# Patient Record
Sex: Male | Born: 1950 | Race: White | Hispanic: No | Marital: Married | Smoking: Never smoker
Health system: Southern US, Community
[De-identification: ages and names within clinical notes are randomized; demographics above are authoritative.]

## PROBLEM LIST (undated history)

## (undated) ENCOUNTER — Telehealth: Attending: Dermatology | Primary: Dermatology

## (undated) ENCOUNTER — Encounter: Attending: Dermatology | Primary: Dermatology

## (undated) ENCOUNTER — Ambulatory Visit

## (undated) ENCOUNTER — Telehealth

## (undated) ENCOUNTER — Ambulatory Visit: Payer: MEDICARE | Attending: Dermatology | Primary: Dermatology

## (undated) ENCOUNTER — Encounter

## (undated) DIAGNOSIS — E039 Hypothyroidism, unspecified: Secondary | ICD-10-CM

## (undated) DIAGNOSIS — G4730 Sleep apnea, unspecified: Secondary | ICD-10-CM

## (undated) DIAGNOSIS — F32A Depression, unspecified: Secondary | ICD-10-CM

## (undated) DIAGNOSIS — K219 Gastro-esophageal reflux disease without esophagitis: Secondary | ICD-10-CM

## (undated) DIAGNOSIS — I10 Essential (primary) hypertension: Secondary | ICD-10-CM

## (undated) DIAGNOSIS — F419 Anxiety disorder, unspecified: Secondary | ICD-10-CM

## (undated) DIAGNOSIS — C73 Malignant neoplasm of thyroid gland: Secondary | ICD-10-CM

## (undated) DIAGNOSIS — M1990 Unspecified osteoarthritis, unspecified site: Secondary | ICD-10-CM

## (undated) DIAGNOSIS — M4800 Spinal stenosis, site unspecified: Secondary | ICD-10-CM

## (undated) DIAGNOSIS — Z85850 Personal history of malignant neoplasm of thyroid: Secondary | ICD-10-CM

## (undated) DIAGNOSIS — N400 Enlarged prostate without lower urinary tract symptoms: Secondary | ICD-10-CM

## (undated) DIAGNOSIS — Z872 Personal history of diseases of the skin and subcutaneous tissue: Secondary | ICD-10-CM

## (undated) HISTORY — DX: Gastro-esophageal reflux disease without esophagitis: K21.9

## (undated) HISTORY — PX: APPENDECTOMY: SHX54

## (undated) HISTORY — DX: Benign prostatic hyperplasia without lower urinary tract symptoms: N40.0

## (undated) HISTORY — DX: Malignant neoplasm of thyroid gland: C73

## (undated) HISTORY — DX: Essential (primary) hypertension: I10

## (undated) HISTORY — PX: LUMBAR LAMINECTOMY: SHX95

## (undated) HISTORY — PX: ROTATOR CUFF REPAIR: SHX139

## (undated) MED ORDER — MUPIROCIN 2 % TOPICAL OINTMENT: 0 days

---

## 2004-09-13 HISTORY — PX: THYROIDECTOMY: SHX17

## 2005-06-16 ENCOUNTER — Ambulatory Visit: Payer: Self-pay

## 2005-07-06 ENCOUNTER — Ambulatory Visit: Payer: Self-pay | Admitting: Internal Medicine

## 2005-07-27 ENCOUNTER — Ambulatory Visit: Payer: Self-pay | Admitting: Internal Medicine

## 2005-08-12 ENCOUNTER — Ambulatory Visit: Payer: Self-pay | Admitting: Unknown Physician Specialty

## 2005-09-10 ENCOUNTER — Ambulatory Visit: Payer: Self-pay | Admitting: Unknown Physician Specialty

## 2005-09-22 ENCOUNTER — Ambulatory Visit: Payer: Self-pay | Admitting: Oncology

## 2005-10-04 ENCOUNTER — Ambulatory Visit: Payer: Self-pay | Admitting: Unknown Physician Specialty

## 2005-10-15 ENCOUNTER — Ambulatory Visit: Payer: Self-pay | Admitting: Oncology

## 2005-11-12 ENCOUNTER — Ambulatory Visit: Payer: Self-pay | Admitting: Oncology

## 2005-11-15 ENCOUNTER — Ambulatory Visit: Payer: Self-pay | Admitting: Oncology

## 2005-12-12 ENCOUNTER — Ambulatory Visit: Payer: Self-pay | Admitting: Oncology

## 2006-01-11 ENCOUNTER — Ambulatory Visit: Payer: Self-pay | Admitting: Oncology

## 2006-03-10 ENCOUNTER — Ambulatory Visit: Payer: Self-pay | Admitting: Oncology

## 2006-03-13 ENCOUNTER — Ambulatory Visit: Payer: Self-pay | Admitting: Oncology

## 2006-06-01 ENCOUNTER — Ambulatory Visit: Payer: Self-pay | Admitting: Oncology

## 2006-06-13 ENCOUNTER — Ambulatory Visit: Payer: Self-pay | Admitting: Oncology

## 2006-08-30 ENCOUNTER — Ambulatory Visit: Payer: Self-pay | Admitting: Oncology

## 2006-09-13 ENCOUNTER — Ambulatory Visit: Payer: Self-pay | Admitting: Oncology

## 2006-12-13 ENCOUNTER — Ambulatory Visit: Payer: Self-pay | Admitting: Internal Medicine

## 2006-12-29 ENCOUNTER — Ambulatory Visit: Payer: Self-pay | Admitting: Oncology

## 2007-01-12 ENCOUNTER — Ambulatory Visit: Payer: Self-pay | Admitting: Oncology

## 2007-01-12 ENCOUNTER — Ambulatory Visit: Payer: Self-pay | Admitting: Internal Medicine

## 2007-02-28 ENCOUNTER — Ambulatory Visit: Payer: Self-pay | Admitting: Oncology

## 2007-03-14 ENCOUNTER — Ambulatory Visit: Payer: Self-pay | Admitting: Oncology

## 2007-04-14 ENCOUNTER — Ambulatory Visit: Payer: Self-pay | Admitting: Internal Medicine

## 2007-05-02 ENCOUNTER — Ambulatory Visit: Payer: Self-pay | Admitting: Internal Medicine

## 2007-05-08 IMAGING — CT CT CHEST W/ CM
1 series · 15 of 34 positions shown, 19 images · IV contrast (APPLIED)
Comparison: none

REASON FOR EXAM: SOB: CT @ 9 CPFT @ [DATE]
COMMENTS:

[Series 4: soft tissue · axial · 0.82mm/px · z∈[-332,-71]mm · 15 of 103 slices shown, 19 images]
[im 8/103  mediastinal]
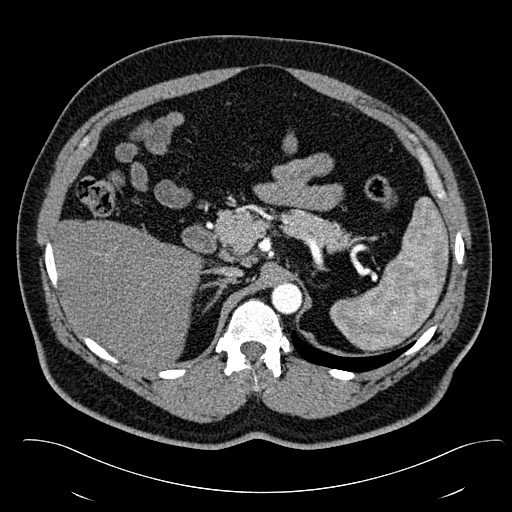
[im 8/103  lung]
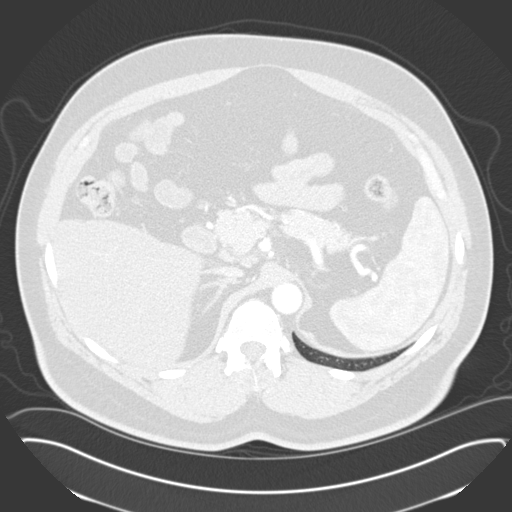
[im 16/103  lung]
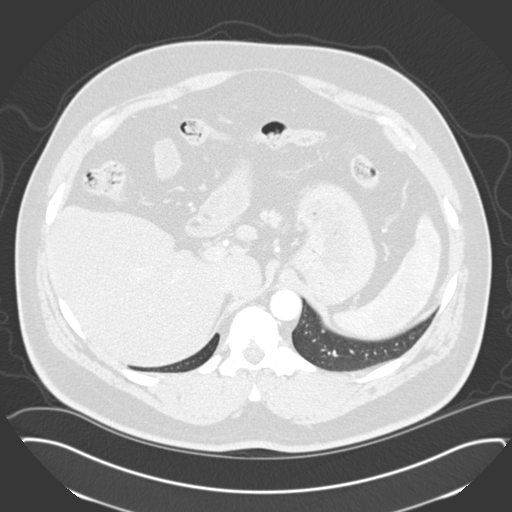
[im 21/103  lung]
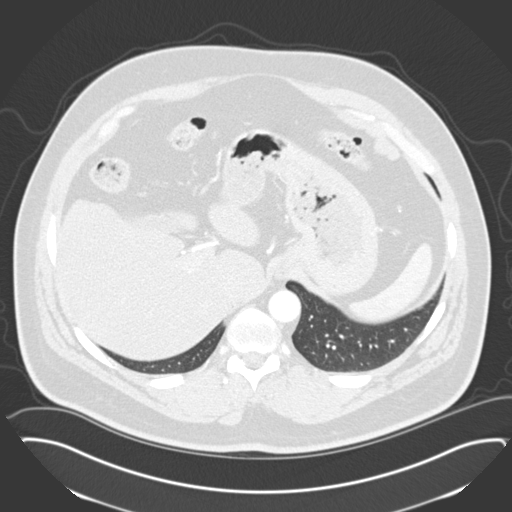
[im 27/103  lung]
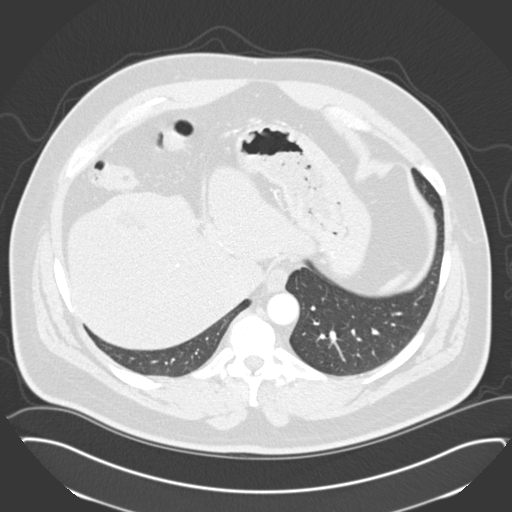
[im 35/103  mediastinal]
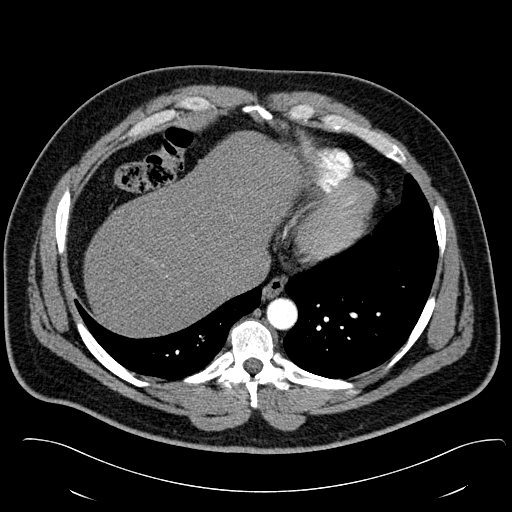
[im 35/103  lung]
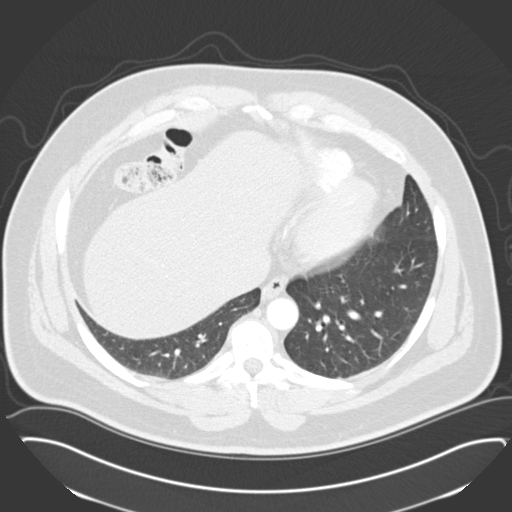
[im 41/103  lung]
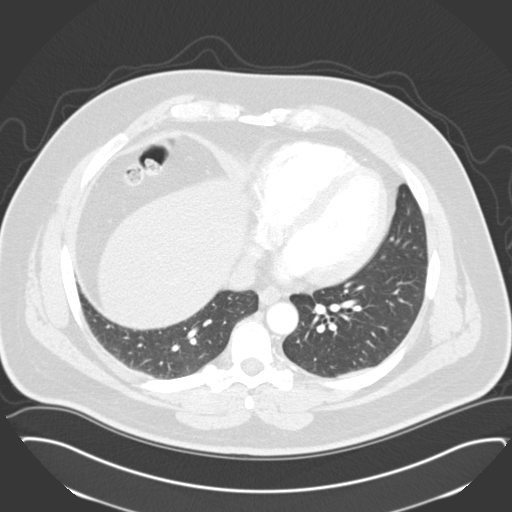
[im 46/103  lung]
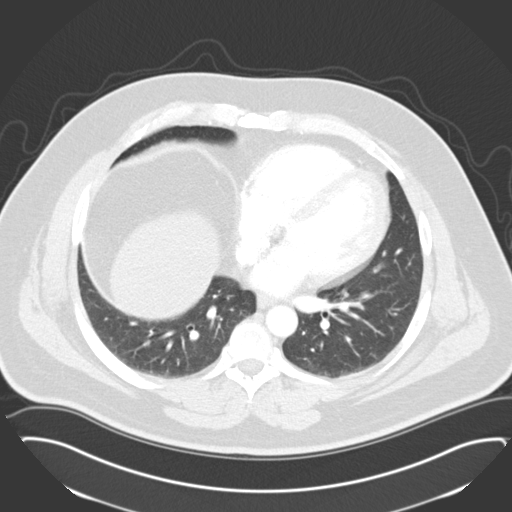
[im 53/103  lung]
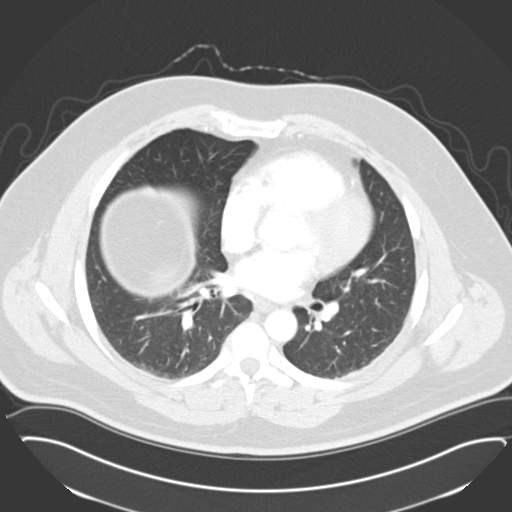
[im 57/103  mediastinal]
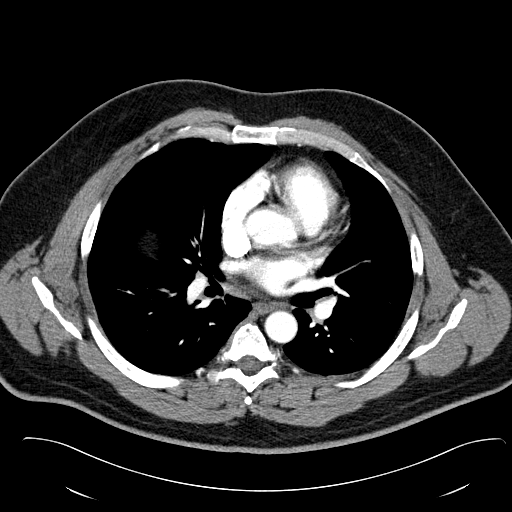
[im 57/103  lung]
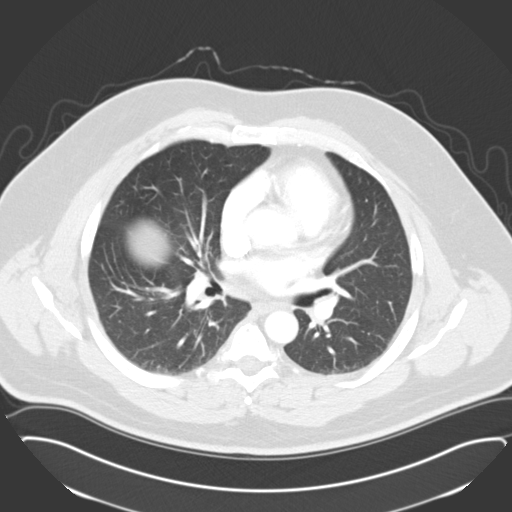
[im 62/103  lung]
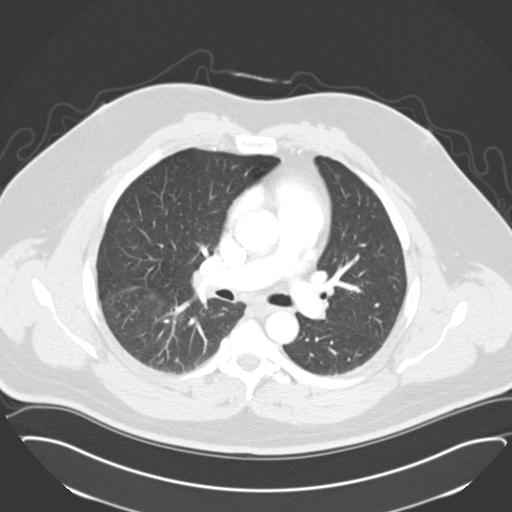
[im 69/103  lung]
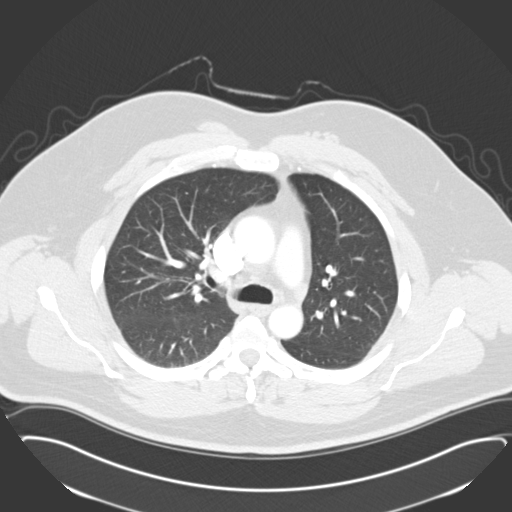
[im 76/103  lung]
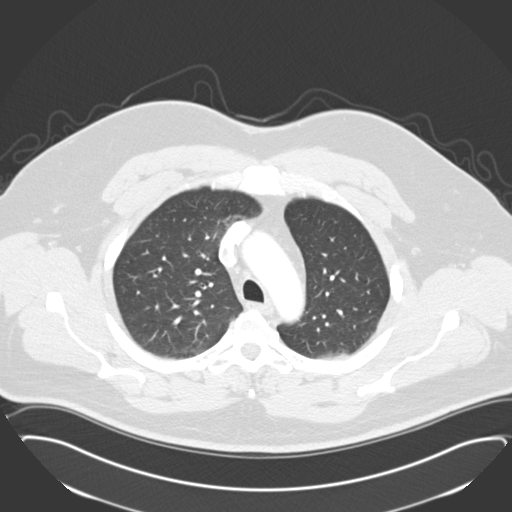
[im 82/103  mediastinal]
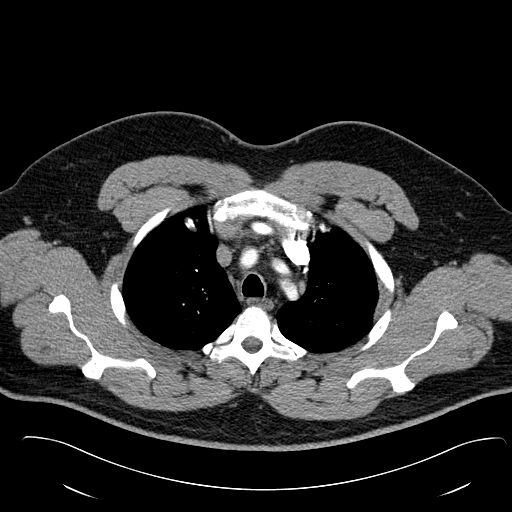
[im 82/103  lung]
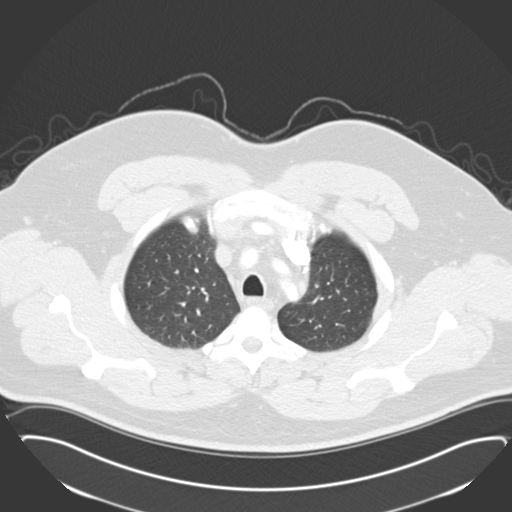
[im 87/103  lung]
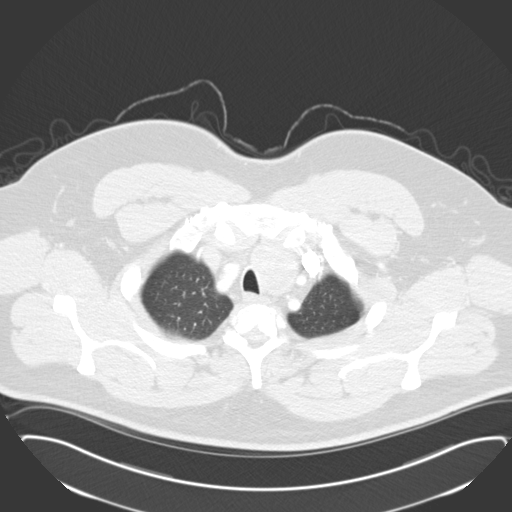
[im 95/103  lung]
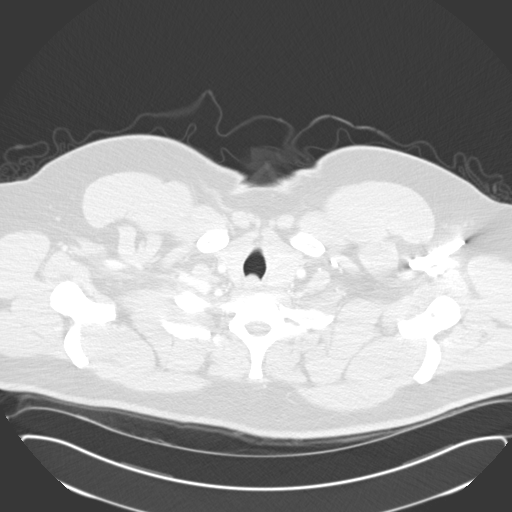

[15 of 34 positions shown; findings below may reference images not displayed]

PROCEDURE:     CT  - CT CHEST WITH CONTRAST  - July 06, 2005  [DATE]

RESULT:     IV contrast enhanced CT of the Chest is performed. The patient
has no prior study for comparison.  The examination demonstrates enlargement
of the LEFT lobe of the thyroid extending into the superior mediastinum with
maximal dimensions of approximately 3.8 cm anterior to posterior and
approximately 4 cm medial to lateral.  A discrete mass is not evident, but
there is some artifact from the contrast bolus in the subclavian vein on the
LEFT.  Within the mediastinum there is no definite adenopathy by size
criteria.  The pulmonary arterial system appears to enhance normally
although the bolus was not timed for evaluation of pulmonary embolism and no
gross embolus is seen.  Some non-enlarged axillary lymph nodes are noted.
There is no pleural or pericardial effusion. The upper abdominal viscera
included on the study appear grossly normal. The gallbladder appears to be
relatively decompressed. On lung window images there is no focal mass or
definite infiltrate. There is no evidence of edema.
IMPRESSION: 1)Enlargement of the LEFT lobe of the thyroid.  No definite cardiopulmonary
abnormality is evident.

## 2007-05-15 ENCOUNTER — Ambulatory Visit: Payer: Self-pay | Admitting: Internal Medicine

## 2007-08-14 ENCOUNTER — Ambulatory Visit: Payer: Self-pay | Admitting: Internal Medicine

## 2007-08-29 ENCOUNTER — Ambulatory Visit: Payer: Self-pay | Admitting: Internal Medicine

## 2007-09-14 ENCOUNTER — Ambulatory Visit: Payer: Self-pay | Admitting: Internal Medicine

## 2007-11-12 ENCOUNTER — Ambulatory Visit: Payer: Self-pay | Admitting: Internal Medicine

## 2007-11-27 ENCOUNTER — Ambulatory Visit: Payer: Self-pay | Admitting: Internal Medicine

## 2007-12-13 ENCOUNTER — Ambulatory Visit: Payer: Self-pay | Admitting: Internal Medicine

## 2008-02-12 ENCOUNTER — Ambulatory Visit: Payer: Self-pay | Admitting: Internal Medicine

## 2008-02-28 ENCOUNTER — Ambulatory Visit: Payer: Self-pay | Admitting: Internal Medicine

## 2008-03-13 ENCOUNTER — Ambulatory Visit: Payer: Self-pay | Admitting: Internal Medicine

## 2008-05-14 ENCOUNTER — Ambulatory Visit: Payer: Self-pay | Admitting: Internal Medicine

## 2008-05-28 ENCOUNTER — Ambulatory Visit: Payer: Self-pay | Admitting: Internal Medicine

## 2008-06-13 ENCOUNTER — Ambulatory Visit: Payer: Self-pay | Admitting: Internal Medicine

## 2008-07-01 ENCOUNTER — Ambulatory Visit: Payer: Self-pay | Admitting: Gastroenterology

## 2008-11-11 ENCOUNTER — Ambulatory Visit: Payer: Self-pay | Admitting: Internal Medicine

## 2008-11-26 ENCOUNTER — Ambulatory Visit: Payer: Self-pay | Admitting: Internal Medicine

## 2008-12-12 ENCOUNTER — Ambulatory Visit: Payer: Self-pay | Admitting: Internal Medicine

## 2009-05-14 ENCOUNTER — Ambulatory Visit: Payer: Self-pay | Admitting: Internal Medicine

## 2009-05-27 ENCOUNTER — Ambulatory Visit: Payer: Self-pay | Admitting: Internal Medicine

## 2009-06-13 ENCOUNTER — Ambulatory Visit: Payer: Self-pay | Admitting: Internal Medicine

## 2010-05-14 ENCOUNTER — Ambulatory Visit: Payer: Self-pay | Admitting: Internal Medicine

## 2010-05-27 ENCOUNTER — Ambulatory Visit: Payer: Self-pay | Admitting: Internal Medicine

## 2010-06-13 ENCOUNTER — Ambulatory Visit: Payer: Self-pay | Admitting: Internal Medicine

## 2010-10-21 ENCOUNTER — Ambulatory Visit: Payer: Self-pay | Admitting: Internal Medicine

## 2010-10-22 ENCOUNTER — Ambulatory Visit: Payer: Self-pay | Admitting: Internal Medicine

## 2010-11-25 ENCOUNTER — Ambulatory Visit: Payer: Self-pay | Admitting: Internal Medicine

## 2010-12-13 ENCOUNTER — Ambulatory Visit: Payer: Self-pay | Admitting: Internal Medicine

## 2011-05-28 ENCOUNTER — Ambulatory Visit: Payer: Self-pay | Admitting: Internal Medicine

## 2011-06-14 ENCOUNTER — Ambulatory Visit: Payer: Self-pay | Admitting: Internal Medicine

## 2012-05-29 ENCOUNTER — Ambulatory Visit: Payer: Self-pay | Admitting: Oncology

## 2012-05-29 LAB — COMPREHENSIVE METABOLIC PANEL
Albumin: 3.6 g/dL (ref 3.4–5.0)
Anion Gap: 8 (ref 7–16)
Calcium, Total: 8.2 mg/dL — ABNORMAL LOW (ref 8.5–10.1)
Chloride: 108 mmol/L — ABNORMAL HIGH (ref 98–107)
EGFR (African American): >60
Glucose: 117 mg/dL — ABNORMAL HIGH (ref 65–99)
Potassium: 3.9 mmol/L (ref 3.5–5.1)
SGOT(AST): 27 U/L (ref 15–37)
SGPT (ALT): 40 U/L (ref 12–78)

## 2012-05-29 LAB — TSH: Thyroid Stimulating Horm: 1.87 u[IU]/mL

## 2012-06-13 ENCOUNTER — Ambulatory Visit: Payer: Self-pay | Admitting: Oncology

## 2013-05-25 ENCOUNTER — Ambulatory Visit: Payer: Self-pay | Admitting: Oncology

## 2013-05-28 LAB — COMPREHENSIVE METABOLIC PANEL
Albumin: 3.7 g/dL (ref 3.4–5.0)
Anion Gap: 10 (ref 7–16)
BUN: 15 mg/dL (ref 7–18)
Calcium, Total: 9.3 mg/dL (ref 8.5–10.1)
Chloride: 104 mmol/L (ref 98–107)
Co2: 29 mmol/L (ref 21–32)
Creatinine: 1.07 mg/dL (ref 0.60–1.30)
EGFR (Non-African Amer.): >60
Glucose: 96 mg/dL (ref 65–99)
Osmolality: 286 (ref 275–301)
Potassium: 4.8 mmol/L (ref 3.5–5.1)
SGOT(AST): 30 U/L (ref 15–37)
SGPT (ALT): 55 U/L (ref 12–78)

## 2013-05-28 LAB — CBC CANCER CENTER
Basophil #: 0 x10 3/mm (ref 0.0–0.1)
Basophil %: 0.6 %
Eosinophil #: 0.3 x10 3/mm (ref 0.0–0.7)
Eosinophil %: 3.7 %
HCT: 48.7 % (ref 40.0–52.0)
Lymphocyte #: 2.5 x10 3/mm (ref 1.0–3.6)
MCH: 29.2 pg (ref 26.0–34.0)
MCHC: 33 g/dL (ref 32.0–36.0)
MCV: 89 fL (ref 80–100)
Monocyte #: 0.6 x10 3/mm (ref 0.2–1.0)
Monocyte %: 7.7 %
Neutrophil #: 3.8 x10 3/mm (ref 1.4–6.5)
Neutrophil %: 52.8 %
RBC: 5.5 10*6/uL (ref 4.40–5.90)
WBC: 7.2 x10 3/mm (ref 3.8–10.6)

## 2013-05-28 LAB — TSH: Thyroid Stimulating Horm: 1.63 u[IU]/mL

## 2013-06-13 ENCOUNTER — Ambulatory Visit: Payer: Self-pay | Admitting: Oncology

## 2014-02-18 ENCOUNTER — Ambulatory Visit: Payer: Self-pay | Admitting: Gastroenterology

## 2014-02-19 LAB — PATHOLOGY REPORT

## 2014-05-31 ENCOUNTER — Ambulatory Visit: Payer: Self-pay | Admitting: Oncology

## 2014-06-03 LAB — COMPREHENSIVE METABOLIC PANEL
ALK PHOS: 46 U/L
ALT: 62 U/L
Albumin: 3.5 g/dL (ref 3.4–5.0)
Anion Gap: 8 (ref 7–16)
BILIRUBIN TOTAL: 0.6 mg/dL (ref 0.2–1.0)
BUN: 17 mg/dL (ref 7–18)
CHLORIDE: 108 mmol/L — AB (ref 98–107)
Calcium, Total: 8.6 mg/dL (ref 8.5–10.1)
Co2: 25 mmol/L (ref 21–32)
Creatinine: 1.06 mg/dL (ref 0.60–1.30)
EGFR (African American): >60
GLUCOSE: 84 mg/dL (ref 65–99)
OSMOLALITY: 282 (ref 275–301)
POTASSIUM: 4.1 mmol/L (ref 3.5–5.1)
SGOT(AST): 38 U/L — ABNORMAL HIGH (ref 15–37)
Sodium: 141 mmol/L (ref 136–145)
TOTAL PROTEIN: 7.2 g/dL (ref 6.4–8.2)

## 2014-06-03 LAB — CBC CANCER CENTER
BASOS ABS: 0.1 x10 3/mm (ref 0.0–0.1)
BASOS PCT: 0.7 %
EOS PCT: 3.2 %
Eosinophil #: 0.2 x10 3/mm (ref 0.0–0.7)
HCT: 49.3 % (ref 40.0–52.0)
HGB: 16.3 g/dL (ref 13.0–18.0)
Lymphocyte #: 2.5 x10 3/mm (ref 1.0–3.6)
Lymphocyte %: 32.9 %
MCH: 29.2 pg (ref 26.0–34.0)
MCHC: 33 g/dL (ref 32.0–36.0)
MCV: 89 fL (ref 80–100)
Monocyte #: 0.5 x10 3/mm (ref 0.2–1.0)
Monocyte %: 6.4 %
Neutrophil #: 4.4 x10 3/mm (ref 1.4–6.5)
Neutrophil %: 56.8 %
Platelet: 180 x10 3/mm (ref 150–440)
RBC: 5.57 10*6/uL (ref 4.40–5.90)
RDW: 14.1 % (ref 11.5–14.5)
WBC: 7.7 x10 3/mm (ref 3.8–10.6)

## 2014-06-03 LAB — TSH: Thyroid Stimulating Horm: 2.35 u[IU]/mL

## 2014-06-06 ENCOUNTER — Ambulatory Visit: Payer: Self-pay | Admitting: Internal Medicine

## 2014-06-13 ENCOUNTER — Ambulatory Visit: Payer: Self-pay | Admitting: Oncology

## 2014-07-14 ENCOUNTER — Ambulatory Visit: Payer: Self-pay | Admitting: Oncology

## 2015-06-04 ENCOUNTER — Inpatient Hospital Stay: Payer: Self-pay | Admitting: Oncology

## 2015-06-10 ENCOUNTER — Inpatient Hospital Stay: Payer: BLUE CROSS/BLUE SHIELD | Attending: Oncology | Admitting: Oncology

## 2015-06-10 ENCOUNTER — Encounter: Payer: Self-pay | Admitting: Oncology

## 2015-06-10 VITALS — BP 122/78 | HR 65 | Temp 96.1°F | Wt 227.2 lb

## 2015-06-10 DIAGNOSIS — Z87891 Personal history of nicotine dependence: Secondary | ICD-10-CM | POA: Insufficient documentation

## 2015-06-10 DIAGNOSIS — C73 Malignant neoplasm of thyroid gland: Secondary | ICD-10-CM | POA: Insufficient documentation

## 2015-06-10 DIAGNOSIS — Z85850 Personal history of malignant neoplasm of thyroid: Secondary | ICD-10-CM | POA: Diagnosis not present

## 2015-06-10 DIAGNOSIS — N63 Unspecified lump in breast: Secondary | ICD-10-CM | POA: Diagnosis not present

## 2015-06-10 DIAGNOSIS — Z79899 Other long term (current) drug therapy: Secondary | ICD-10-CM | POA: Insufficient documentation

## 2015-06-10 HISTORY — DX: Malignant neoplasm of thyroid gland: C73

## 2015-06-10 NOTE — Progress Notes (Signed)
Patient does have living will.  Smoked for about a year when a teenager.

## 2015-06-10 NOTE — Progress Notes (Signed)
[facility]  [phone]  Fax:([phone]     Terry Odonnell. DOB: 02-12-1951  MR#: [id number]  QIO#:[id number]  Patient Care Team: Tracie Harrier, MD as PCP - General (Internal Medicine)  CHIEF COMPLAINT:  Chief Complaint  Patient presents with  . 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   INTERVAL HISTORY:  Patient is here for further follow up regarding history of Thyroid Cancer diagnosed in 2006. He reports having started dieting and exercising and has lost 75+ pounds since approximately the first of the year. He states that he has routine 6 months follow up with Dr. Ginette Pitman on October 6th, with routine labs scheduled for September 29th.  He denies any other acute complaints and report feeling very well.   REVIEW OF SYSTEMS:   Review of Systems  Constitutional: Positive for weight loss. Negative for fever, chills, malaise/fatigue and diaphoresis.       Intentional weight loss of 76 pounds over the last several months.  HENT: Negative for congestion, ear discharge, ear pain, hearing loss, nosebleeds, sore throat and tinnitus.   Eyes: Negative for blurred vision, double vision, photophobia, pain, discharge and redness.  Respiratory: Negative for cough, hemoptysis, sputum production, shortness of breath, wheezing and stridor.   Cardiovascular: Negative for chest pain, palpitations, orthopnea, claudication, leg swelling and PND.  Gastrointestinal: Negative for heartburn, nausea, vomiting, abdominal pain, diarrhea, constipation, blood in stool and melena.  Genitourinary: Negative.   Musculoskeletal: Negative.   Skin: Negative.   Neurological: Negative for dizziness, tingling, focal weakness, seizures, weakness and headaches.  Endo/Heme/Allergies: Does not bruise/bleed easily.  Psychiatric/Behavioral: Negative for depression. The patient is not nervous/anxious and does not have insomnia.     As per  HPI. Otherwise, a complete review of systems is negatve.  ONCOLOGY HISTORY:   Follicular cancer of thyroid   08/13/2005 Initial Diagnosis Follicular cancer of thyroid    PAST MEDICAL HISTORY: Past Medical History  Diagnosis Date  . 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  GYNECOLOGIC HISTORY:  No LMP for male patient.     ADVANCED DIRECTIVES:    HEALTH MAINTENANCE: Social History  Substance Use Topics  . Smoking status: Former Research scientist (life sciences)  . Smokeless tobacco: Not on file  . Alcohol Use: Not on file     Colonoscopy:  PAP:  Bone density:  Lipid panel:  Allergies  Allergen Reactions  . Chlorhexidine Rash    Had rash following shoulder surgery in distribution of surgical ChloroPrep    Current Outpatient Prescriptions  Medication Sig Dispense Refill  . Clobetasol Propionate Emulsion 0.05 % topical foam     . levothyroxine (SYNTHROID, LEVOTHROID) 150 MCG tablet Take by mouth.    Marland Kitchen lisinopril-hydrochlorothiazide (PRINZIDE,ZESTORETIC) 20-12.5 MG tablet Take by mouth.    Marland Kitchen omeprazole (PRILOSEC) 20 MG capsule Take by mouth.    . sertraline (ZOLOFT) 50 MG tablet Take by mouth.    . tamsulosin (FLOMAX) 0.4 MG CAPS capsule Take by mouth.     No current facility-administered medications for this visit.    OBJECTIVE: BP 122/78 mmHg  Pulse 65  Temp(Src) 96.1 F (35.6 C) (Tympanic)  Wt 227 lb 4 oz (103.08 kg)   There is no height on file to calculate BMI.    ECOG FS:0 - Asymptomatic  General: Well-developed, well-nourished, no acute distress. Eyes: Pink conjunctiva, anicteric sclera. HEENT:  Normocephalic, moist mucous membranes, clear oropharnyx. Lungs: Clear to auscultation bilaterally. Heart: Regular rate and rhythm. No rubs, murmurs, or gallops. Abdomen: Soft, nontender, nondistended. No organomegaly noted, normoactive bowel sounds. Breast: Breast palpated in a circular manner in the  sitting and supine positions.  No masses or fullness palpated.  Axilla palpated in both positions with no masses or fullness palpated.  Musculoskeletal: No edema, cyanosis, or clubbing. Neuro: Alert, answering all questions appropriately. Cranial nerves grossly intact. Skin: No rashes or petechiae noted. Psych: Normal affect. Lymphatics: No cervical, calvicular, axillary or inguinal LAD.   LAB RESULTS:  No visits with results within 3 Day(s) from this visit. Latest known visit with results is:  [facility] Conversion on 05/31/2014  Component Date Value Ref Range Status  . WBC 06/03/2014 7.7  3.8-10.6 x10 3/mm  Final  . RBC 06/03/2014 5.57  4.40-5.90 x10 6/mm  Final  . HGB 06/03/2014 16.3  13.0-18.0 g/dL Final  . HCT 06/03/2014 49.3  40.0-52.0 % Final  . MCV 06/03/2014 89  80-100 fL Final  . MCH 06/03/2014 29.2  26.0-34.0 pg Final  . MCHC 06/03/2014 33.0  32.0-36.0 g/dL Final  . RDW 06/03/2014 14.1  11.5-14.5 % Final  . Platelet 06/03/2014 180  150-440 x10 3/mm  Final  . Neutrophil % 06/03/2014 56.8   Final  . Lymphocyte % 06/03/2014 32.9   Final  . Monocyte % 06/03/2014 6.4   Final  . Eosinophil % 06/03/2014 3.2   Final  . Basophil % 06/03/2014 0.7   Final  . Neutrophil # 06/03/2014 4.4  1.4-6.5 x10 3/mm  Final  . Lymphocyte # 06/03/2014 2.5  1.0-3.6 x10 3/mm  Final  . Monocyte # 06/03/2014 0.5  0.2-1.0 x10 3/mm  Final  . Eosinophil # 06/03/2014 0.2  0.0-0.7 x10 3/mm  Final  . Basophil # 06/03/2014 0.1  0.0-0.1 x10 3/mm  Final  . Glucose 06/03/2014 84  65-99 mg/dL Final  . BUN 06/03/2014 17  7-18 mg/dL Final  . Creatinine 06/03/2014 1.06  0.60-1.30 mg/dL Final  . Sodium 06/03/2014 141  136-145 mmol/L Final  . Potassium 06/03/2014 4.1  3.5-5.1 mmol/L Final  . Chloride 06/03/2014 108* 98-107 mmol/L Final  . Co2 06/03/2014 25  21-32 mmol/L Final  . Calcium, Total 06/03/2014 8.6  8.5-10.1 mg/dL Final  . SGOT(AST) 06/03/2014 38* 15-37 Unit/L Final  . SGPT (ALT) 06/03/2014 62   Final    Comment: 14-63 NOTE: New Reference Range 04/02/14   . Alkaline Phosphatase 06/03/2014 46   Final   Comment: 46-116 NOTE: New Reference Range 04/02/14   . Albumin 06/03/2014 3.5  3.4-5.0 g/dL Final  . Total Protein 06/03/2014 7.2  6.4-8.2 g/dL Final  . Bilirubin,Total 06/03/2014 0.6  0.2-1.0 mg/dL Final  . Osmolality 06/03/2014 282  275-301 Final  . Anion Gap 06/03/2014 8  7-16 Final  . EGFR (African American) 06/03/2014 >60   Final  . EGFR (Non-African Amer.) 06/03/2014 >60   Final   Comment: eGFR values <18m/min/1.73 m2 may be an indication of chronic kidney disease (CKD). Calculated eGFR is useful in patients with stable renal function. The eGFR calculation will not be reliable in acutely ill patients when serum creatinine is changing rapidly. It is not useful in  patients on dialysis. The eGFR calculation may not be applicable to patients at the low and high extremes of body sizes, pregnant women, and vegetarians.   . Thyroid Stimulating Horm 06/03/2014 2.35   Final   Comment: 0.45-4.50 (IU = International Unit)  -----------------------  Pregnant patients have  different reference  ranges for TSH:  - - - - - - - - - -  Pregnant, first trimetser:  0.36 - 2.50 uIU/mL     STUDIES: No results found.  ASSESSMENT:  Thyroid Cancer. History of right breast mass.  PLAN:  1. Thyroid Cancer. Carcinoma of thyroid, follicular, status post total thyroidectomy. Clinically there are no signs of recurrent or progressive disease. Patient is currently taking  Levothyroxine 120mg daily. He is scheduled to have full panel of lab work performed on September 29th and routine follow up with PCP on October 6th.  Patient has had a significant weight loss due to dietary changes and weight loss.  2. Hx of right breast mass. Right breast mass FNA was negative for malignancy. No further intervention at this time. Breast exam was performed at request of patient. No evidence of any abnormality.     Patient expressed understanding and was in agreement with this plan. He also understands that He can call clinic at any time with any questions, concerns, or complaints.   Dr. COliva Bustardwas available for consultation and review of plan of care for this patient.   LEvlyn Kanner NP   06/10/2015

## 2016-06-08 ENCOUNTER — Other Ambulatory Visit: Payer: Self-pay | Admitting: *Deleted

## 2016-06-08 DIAGNOSIS — C73 Malignant neoplasm of thyroid gland: Secondary | ICD-10-CM

## 2016-06-08 NOTE — Progress Notes (Signed)
[place]  Telephone:([phone]  [phone]     Terry Odonnell. DOB: [birth year]  MR#: [id number]  SUP#:[id number]  Patient Care Team: Tracie Harrier, MD as PCP - General (Internal Medicine)  CHIEF COMPLAINT: Follicular cancer of the thyroid diagnosed in December 2006, pT3N0M0.   INTERVAL HISTORY:  Patient returns to clinic today for routine yearly follow-up of his thyroid cancer. He currently feels well and is asymptomatic. He has no neurologic complaint. He denies any recent fevers or illnesses. He had a significant amount of intentional weight loss recently, but his weight has now stable. He has no chest pain or shortness of breath. He denies any nausea, vomiting, constipation, or diarrhea. He has no urinary complaints. Patient feels at his baseline and offers no specific complaints today.  REVIEW OF SYSTEMS:   Review of Systems  Constitutional: Negative.  Negative for fever, malaise/fatigue and weight loss.  Respiratory: Negative.  Negative for cough and shortness of breath.   Cardiovascular: Negative.  Negative for chest pain and leg swelling.  Gastrointestinal: Negative.  Negative for abdominal pain.  Genitourinary: Negative.   Musculoskeletal: Negative.   Neurological: Negative.  Negative for weakness.  Psychiatric/Behavioral: Negative.  The patient is not nervous/anxious.     As per HPI. Otherwise, a complete review of systems is negative.  ONCOLOGY HISTORY: Oncology History   Carcinoma of thyroid follicular, status post total thyroidectomy December 2006.     Follicular cancer of thyroid ([place])   08/13/2005 Initial Diagnosis    Follicular cancer of thyroid       PAST MEDICAL HISTORY: Past Medical History:  Diagnosis Date  . 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  GYNECOLOGIC HISTORY:  No LMP for male patient.     ADVANCED DIRECTIVES:     HEALTH MAINTENANCE: Social History  Substance Use Topics  . Smoking status: Former Research scientist (life sciences)  . Smokeless tobacco: Not on file  . Alcohol use Not on file     Colonoscopy:  PAP:  Bone density:  Lipid panel:  Allergies  Allergen Reactions  . Chlorhexidine Rash    Had rash following shoulder surgery in distribution of surgical ChloroPrep    Current Outpatient Prescriptions  Medication Sig Dispense Refill  . Clobetasol Propionate Emulsion 0.05 % topical foam Apply topically 2 (two) times daily.     Marland Kitchen levothyroxine (SYNTHROID, LEVOTHROID) 150 MCG tablet Take 150 mcg by mouth daily before breakfast.     . lisinopril-hydrochlorothiazide (PRINZIDE,ZESTORETIC) 20-12.5 MG tablet Take 1 tablet by mouth daily.     Marland Kitchen omeprazole (PRILOSEC) 20 MG capsule Take 20 mg by mouth daily.     . sertraline (ZOLOFT) 50 MG tablet Take 50 mg by mouth daily.     . tamsulosin (FLOMAX) 0.4 MG CAPS capsule Take 0.4 mg by mouth daily.      No current facility-administered medications for this visit.     OBJECTIVE: BP 130/85 (BP Location: Right Arm, Patient Position: Sitting)   Pulse 66   Temp 98.1 F (36.7 C) (Oral)   Resp 18   Wt 250 lb 14.1 oz (113.8 kg)    There is no height or weight on file to calculate BMI.    ECOG FS:0 - Asymptomatic  General: Well-developed, well-nourished, no acute distress. Eyes: Pink conjunctiva, anicteric sclera. HEENT: Normocephalic, moist mucous membranes, clear oropharnyx. Lungs: Clear to auscultation bilaterally. Heart: Regular rate and rhythm. No rubs, murmurs, or gallops.  Abdomen: Soft, nontender, nondistended. No organomegaly noted, normoactive bowel sounds. Musculoskeletal: No edema, cyanosis, or clubbing. Neuro: Alert, answering all questions appropriately. Cranial nerves grossly intact. Skin: No rashes or petechiae noted. Psych: Normal affect.   LAB RESULTS:  Appointment on 06/09/2016  Component Date Value Ref Range Status  . WBC 06/09/2016 10.5  3.8 -  10.6 K/uL Final  . RBC 06/09/2016 5.39  4.40 - 5.90 MIL/uL Final  . Hemoglobin 06/09/2016 15.7  13.0 - 18.0 g/dL Final  . HCT 06/09/2016 46.2  40.0 - 52.0 % Final  . MCV 06/09/2016 85.7  80.0 - 100.0 fL Final  . MCH 06/09/2016 29.2  26.0 - 34.0 pg Final  . MCHC 06/09/2016 34.1  32.0 - 36.0 g/dL Final  . RDW 06/09/2016 13.5  11.5 - 14.5 % Final  . Platelets 06/09/2016 239  150 - 440 K/uL Final  . Neutrophils Relative % 06/09/2016 61  % Final  . Neutro Abs 06/09/2016 6.5  1.4 - 6.5 K/uL Final  . Lymphocytes Relative 06/09/2016 29  % Final  . Lymphs Abs 06/09/2016 3.0  1.0 - 3.6 K/uL Final  . Monocytes Relative 06/09/2016 6  % Final  . Monocytes Absolute 06/09/2016 0.7  0.2 - 1.0 K/uL Final  . Eosinophils Relative 06/09/2016 3  % Final  . Eosinophils Absolute 06/09/2016 0.3  0 - 0.7 K/uL Final  . Basophils Relative 06/09/2016 1  % Final  . Basophils Absolute 06/09/2016 0.1  0 - 0.1 K/uL Final  . Sodium 06/09/2016 135  135 - 145 mmol/L Final  . Potassium 06/09/2016 4.0  3.5 - 5.1 mmol/L Final  . Chloride 06/09/2016 105  101 - 111 mmol/L Final  . CO2 06/09/2016 23  22 - 32 mmol/L Final  . Glucose, Bld 06/09/2016 94  65 - 99 mg/dL Final  . BUN 06/09/2016 19  6 - 20 mg/dL Final  . Creatinine, Ser 06/09/2016 1.12  0.61 - 1.24 mg/dL Final  . Calcium 06/09/2016 9.3  8.9 - 10.3 mg/dL Final  . Total Protein 06/09/2016 7.7  6.5 - 8.1 g/dL Final  . Albumin 06/09/2016 4.3  3.5 - 5.0 g/dL Final  . AST 06/09/2016 20  15 - 41 U/L Final  . ALT 06/09/2016 24  17 - 63 U/L Final  . Alkaline Phosphatase 06/09/2016 48  38 - 126 U/L Final  . Total Bilirubin 06/09/2016 0.9  0.3 - 1.2 mg/dL Final  . GFR calc non Af Amer 06/09/2016 >60  >60 mL/min Final  . GFR calc Af Amer 06/09/2016 >60  >60 mL/min Final   Comment: (NOTE) The eGFR has been calculated using the CKD EPI equation. This calculation has not been validated in all clinical situations. eGFR's persistently <60 mL/min signify possible Chronic  Kidney Disease.   . Anion gap 06/09/2016 7  5 - 15 Final  . TSH 06/09/2016 5.848* 0.350 - 4.500 uIU/mL Final    STUDIES: No results found.  ASSESSMENT: Follicular cancer of the thyroid   PLAN:   1. Follicular cancer of the thyroid: Status post total thyroidectomy in 2006. Clinically there are no signs of recurrent or progressive disease. Continue Synthroid 142mg daily. Patient reports his thyroid labs are monitored by his PCP. Patient is now greater than 11 years removed from his thyroidectomy and iodide ablation and can be discharged from clinic.   Patient expressed understanding and was in agreement with this plan. He also understands that He can call clinic at any time with any questions, concerns, or complaints.  Lloyd Huger, MD   06/12/2016

## 2016-06-09 ENCOUNTER — Inpatient Hospital Stay: Payer: BLUE CROSS/BLUE SHIELD | Attending: Oncology | Admitting: Oncology

## 2016-06-09 ENCOUNTER — Inpatient Hospital Stay: Payer: BLUE CROSS/BLUE SHIELD

## 2016-06-09 ENCOUNTER — Encounter (INDEPENDENT_AMBULATORY_CARE_PROVIDER_SITE_OTHER): Payer: Self-pay

## 2016-06-09 ENCOUNTER — Ambulatory Visit: Payer: BLUE CROSS/BLUE SHIELD | Admitting: Oncology

## 2016-06-09 VITALS — BP 130/85 | HR 66 | Temp 98.1°F | Resp 18 | Wt 250.9 lb

## 2016-06-09 DIAGNOSIS — Z79899 Other long term (current) drug therapy: Secondary | ICD-10-CM

## 2016-06-09 DIAGNOSIS — C73 Malignant neoplasm of thyroid gland: Secondary | ICD-10-CM

## 2016-06-09 DIAGNOSIS — E890 Postprocedural hypothyroidism: Secondary | ICD-10-CM | POA: Diagnosis not present

## 2016-06-09 DIAGNOSIS — Z87891 Personal history of nicotine dependence: Secondary | ICD-10-CM | POA: Diagnosis not present

## 2016-06-09 DIAGNOSIS — Z85850 Personal history of malignant neoplasm of thyroid: Secondary | ICD-10-CM | POA: Diagnosis present

## 2016-06-09 LAB — COMPREHENSIVE METABOLIC PANEL
ALT: 24 U/L (ref 17–63)
AST: 20 U/L (ref 15–41)
Albumin: 4.3 g/dL (ref 3.5–5.0)
Alkaline Phosphatase: 48 U/L (ref 38–126)
Anion gap: 7 (ref 5–15)
BILIRUBIN TOTAL: 0.9 mg/dL (ref 0.3–1.2)
BUN: 19 mg/dL (ref 6–20)
CO2: 23 mmol/L (ref 22–32)
CREATININE: 1.12 mg/dL (ref 0.61–1.24)
Calcium: 9.3 mg/dL (ref 8.9–10.3)
Chloride: 105 mmol/L (ref 101–111)
Glucose, Bld: 94 mg/dL (ref 65–99)
POTASSIUM: 4 mmol/L (ref 3.5–5.1)
Sodium: 135 mmol/L (ref 135–145)
TOTAL PROTEIN: 7.7 g/dL (ref 6.5–8.1)

## 2016-06-09 LAB — CBC WITH DIFFERENTIAL/PLATELET
BASOS PCT: 1 %
Basophils Absolute: 0.1 10*3/uL (ref 0–0.1)
EOS ABS: 0.3 10*3/uL (ref 0–0.7)
EOS PCT: 3 %
HEMATOCRIT: 46.2 % (ref 40.0–52.0)
Hemoglobin: 15.7 g/dL (ref 13.0–18.0)
LYMPHS ABS: 3 10*3/uL (ref 1.0–3.6)
Lymphocytes Relative: 29 %
MCH: 29.2 pg (ref 26.0–34.0)
MCHC: 34.1 g/dL (ref 32.0–36.0)
MCV: 85.7 fL (ref 80.0–100.0)
MONOS PCT: 6 %
Monocytes Absolute: 0.7 10*3/uL (ref 0.2–1.0)
Neutro Abs: 6.5 10*3/uL (ref 1.4–6.5)
Neutrophils Relative %: 61 %
Platelets: 239 10*3/uL (ref 150–440)
RBC: 5.39 MIL/uL (ref 4.40–5.90)
RDW: 13.5 % (ref 11.5–14.5)
WBC: 10.5 10*3/uL (ref 3.8–10.6)

## 2016-06-09 LAB — TSH: TSH: 5.848 u[IU]/mL — AB (ref 0.350–4.500)

## 2016-06-09 NOTE — Progress Notes (Signed)
Offers no complaints  

## 2017-11-04 ENCOUNTER — Ambulatory Visit: Admit: 2017-11-04 | Discharge: 2017-11-05 | Payer: MEDICARE

## 2018-09-25 ENCOUNTER — Ambulatory Visit: Payer: BLUE CROSS/BLUE SHIELD

## 2018-09-25 ENCOUNTER — Ambulatory Visit: Payer: BLUE CROSS/BLUE SHIELD | Admitting: Hematology and Oncology

## 2018-10-03 ENCOUNTER — Other Ambulatory Visit: Payer: Self-pay | Admitting: Hematology and Oncology

## 2018-10-03 ENCOUNTER — Telehealth: Payer: Self-pay

## 2018-10-03 DIAGNOSIS — L400 Psoriasis vulgaris: Secondary | ICD-10-CM | POA: Insufficient documentation

## 2018-10-03 NOTE — Telephone Encounter (Signed)
Spoke with Abby with [facility] to request recent lab work that was completed at their office.

## 2018-10-03 NOTE — Telephone Encounter (Signed)
[facility] d/t not receiving CBC and CMP results. [facility] reports they received results from Dr. Linton Ham office and to contact them to receive results. Contacted Dr. Linton Ham office and receptionist states she will send a message to the nurse.

## 2018-10-04 ENCOUNTER — Inpatient Hospital Stay: Payer: Medicare Other | Attending: Hematology and Oncology | Admitting: Hematology and Oncology

## 2018-10-04 ENCOUNTER — Encounter: Payer: Self-pay | Admitting: Hematology and Oncology

## 2018-10-04 ENCOUNTER — Inpatient Hospital Stay: Payer: Medicare Other

## 2018-10-04 VITALS — BP 122/78 | HR 84 | Temp 98.8°F | Resp 18 | Ht 61.0 in | Wt 292.9 lb

## 2018-10-04 DIAGNOSIS — Z79899 Other long term (current) drug therapy: Secondary | ICD-10-CM

## 2018-10-04 DIAGNOSIS — L400 Psoriasis vulgaris: Secondary | ICD-10-CM | POA: Diagnosis not present

## 2018-10-04 DIAGNOSIS — L409 Psoriasis, unspecified: Principal | ICD-10-CM

## 2018-10-04 DIAGNOSIS — Z85850 Personal history of malignant neoplasm of thyroid: Secondary | ICD-10-CM

## 2018-10-04 MED ORDER — TILDRAKIZUMAB-ASMN 100 MG/ML ~~LOC~~ SOSY
100.0000 mg | PREFILLED_SYRINGE | Freq: Once | SUBCUTANEOUS | Status: AC
  Administered 2018-10-04: 100 mg via SUBCUTANEOUS
  Filled 2018-10-04: qty 1

## 2018-10-04 NOTE — Progress Notes (Signed)
[facility]     [address]     [place], [place] [address]     Phone: [phone]      Fax: [phone]        Clinic day:  10/04/2018  Chief Complaint: Terry Odonnell. is a 68 y.o. 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 Sarina Ser for initiation of Ilumya.  HPI:  The patient was diagnosed with scalp psoriasis  5 years ago.  He notes some injections in his right shoulder that helped to clear things up 2 years.  He then had back surgery and "shots in the back" which helped improve his skin.  He notes involvement of his "whole head" with severe psoriasis.  He has used salves, creams, foams, and shampoo.  He was on Otezla (apremilast) x 30 days.  He has had laser treatment of his scalp x 2 months (last 1-2 weeks ago).  He was last seen in the dermatology clinic on 07/12/2018.  Rutherford Nail was discontinued secondary to progressive psoriasis.  He was to continue Sorilux foam and clabetasol shampoo.  Quantiferon gold was negative on 08/04/2018.  HIV, hepatitis C, and hepatitis B surface antigen were negative on 08/04/2018.  Symptomatically, he feels fine.  He denies any B symptoms.  One week ago, he had a cough and stuffy nose.  He received a steroid burst.  He had no fever.  He does not feel sick.  He has no cough, chest pain or GI symptoms.  He denies any urinary symptoms.  He has a history of thyroid cancer in 2006.  He is s/p total thyroidectomy by Dr Tami Ribas followed by I-131.  He initially saw Dr. Oliva Bustard and then Dr. Grayland Ormond (last 06/09/2016).  His father had lymphoma.   Past Medical History:  Diagnosis Date  . BPH (benign prostatic hyperplasia)   . 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 GERD (gastroesophageal reflux disease)   . Hypertension     Past Surgical History:  Procedure Laterality Date  . APPENDECTOMY    . THYROIDECTOMY  2006    Family History  Problem Relation Age of Onset  .  Lymphoma Father     Social History:  reports that he has quit smoking. He has never used smokeless tobacco. No history on file for alcohol and drug.  He drinks beer 2-3 times a month.  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  He lives in [place].  The patient is alone today.  Allergies:  Allergies  Allergen Reactions  . Chlorhexidine Rash    Had rash following shoulder surgery in distribution of surgical ChloroPrep    Current Medications: Current Outpatient Medications  Medication Sig Dispense Refill  . Clobetasol Propionate Emulsion 0.05 % topical foam Apply topically 2 (two) times daily.     Marland Kitchen levothyroxine (SYNTHROID, LEVOTHROID) 150 MCG tablet Take 150 mcg by mouth daily before breakfast.     . lisinopril-hydrochlorothiazide (PRINZIDE,ZESTORETIC) 20-12.5 MG tablet Take 1 tablet by mouth daily.     Marland Kitchen omeprazole (PRILOSEC) 20 MG capsule Take 20 mg by mouth daily.     . sertraline (ZOLOFT) 50 MG tablet Take 50 mg by mouth daily.     . tamsulosin (FLOMAX) 0.4 MG CAPS capsule Take 0.4 mg by mouth daily.      No current facility-administered medications for this visit.     Review of Systems:  GENERAL:  Feels "fine".  No fevers,  sweats or weight loss. PERFORMANCE STATUS (ECOG):  1 HEENT:  No visual changes, runny nose, sore throat, mouth sores or tenderness. Lungs: No shortness of breath or cough.  No hemoptysis. Cardiac:  No chest pain, palpitations, orthopnea, or PND. GI:  No nausea, vomiting, diarrhea, constipation, melena or hematochezia. GU:  No urgency, frequency, dysuria, or hematuria. Musculoskeletal:  No back pain.  No joint pain.  No muscle tenderness. Extremities:  No pain or swelling. Skin: Severe scalp psoriasis. Neuro:  No headache, numbness or weakness, balance or coordination issues. Endocrine:  No diabetes.  H/o thyroid cancer s/p thyroidectomy; on Synthroid.  No hot flashes or night sweats. Psych:  No mood changes, depression or anxiety. Pain:  No focal  pain. Review of systems:  All other systems reviewed and found to be negative.  Physical Exam: Blood pressure 122/78, pulse 84, temperature 98.8 F (37.1 C), temperature source Tympanic, resp. rate 18, height '5\' 1"'  (1.549 m), weight 292 lb 14.1 oz (132.9 kg), SpO2 97 %. GENERAL:  Well developed, well nourished, gentleman sitting comfortably in the exam room in no acute distress. MENTAL STATUS:  Alert and oriented to person, place and time. HEAD:  Lilyan Punt and goatee.  Normocephalic, atraumatic, face symmetric, no Cushingoid features. EYES:  Brown eyes.  Pupils equal round and reactive to light and accomodation.  No conjunctivitis or scleral icterus. ENT:  Oropharynx clear without lesion.  Tongue normal. Mucous membranes moist.  RESPIRATORY:  Clear to auscultation without rales, wheezes or rhonchi. CARDIOVASCULAR:  Regular rate and rhythm without murmur, rub or gallop. ABDOMEN:  Soft, non-tender, with active bowel sounds, and no hepatosplenomegaly.  No masses. SKIN:  Scalp psoriasis (entire scalp). EXTREMITIES: No edema, no skin discoloration or tenderness.  No palpable cords. LYMPH NODES: No palpable cervical, supraclavicular, axillary or inguinal adenopathy  NEUROLOGICAL: Unremarkable. PSYCH:  Appropriate.   No visits with results within 3 Day(s) from this visit.  Latest known visit with results is:  Appointment on 06/09/2016  Component Date Value Ref Range Status  . WBC 06/09/2016 10.5  3.8 - 10.6 K/uL Final  . RBC 06/09/2016 5.39  4.40 - 5.90 MIL/uL Final  . Hemoglobin 06/09/2016 15.7  13.0 - 18.0 g/dL Final  . HCT 06/09/2016 46.2  40.0 - 52.0 % Final  . MCV 06/09/2016 85.7  80.0 - 100.0 fL Final  . MCH 06/09/2016 29.2  26.0 - 34.0 pg Final  . MCHC 06/09/2016 34.1  32.0 - 36.0 g/dL Final  . RDW 06/09/2016 13.5  11.5 - 14.5 % Final  . Platelets 06/09/2016 239  150 - 440 K/uL Final  . Neutrophils Relative % 06/09/2016 61  % Final  . Neutro Abs 06/09/2016 6.5  1.4 - 6.5 K/uL Final   . Lymphocytes Relative 06/09/2016 29  % Final  . Lymphs Abs 06/09/2016 3.0  1.0 - 3.6 K/uL Final  . Monocytes Relative 06/09/2016 6  % Final  . Monocytes Absolute 06/09/2016 0.7  0.2 - 1.0 K/uL Final  . Eosinophils Relative 06/09/2016 3  % Final  . Eosinophils Absolute 06/09/2016 0.3  0 - 0.7 K/uL Final  . Basophils Relative 06/09/2016 1  % Final  . Basophils Absolute 06/09/2016 0.1  0 - 0.1 K/uL Final  . Sodium 06/09/2016 135  135 - 145 mmol/L Final  . Potassium 06/09/2016 4.0  3.5 - 5.1 mmol/L Final  . Chloride 06/09/2016 105  101 - 111 mmol/L Final  . CO2 06/09/2016 23  22 - 32 mmol/L Final  . Glucose,  Bld 06/09/2016 94  65 - 99 mg/dL Final  . BUN 06/09/2016 19  6 - 20 mg/dL Final  . Creatinine, Ser 06/09/2016 1.12  0.61 - 1.24 mg/dL Final  . Calcium 06/09/2016 9.3  8.9 - 10.3 mg/dL Final  . Total Protein 06/09/2016 7.7  6.5 - 8.1 g/dL Final  . Albumin 06/09/2016 4.3  3.5 - 5.0 g/dL Final  . AST 06/09/2016 20  15 - 41 U/L Final  . ALT 06/09/2016 24  17 - 63 U/L Final  . Alkaline Phosphatase 06/09/2016 48  38 - 126 U/L Final  . Total Bilirubin 06/09/2016 0.9  0.3 - 1.2 mg/dL Final  . GFR calc non Af Amer 06/09/2016 >60  >60 mL/min Final  . GFR calc Af Amer 06/09/2016 >60  >60 mL/min Final   Comment: (NOTE) The eGFR has been calculated using the CKD EPI equation. This calculation has not been validated in all clinical situations. eGFR's persistently <60 mL/min signify possible Chronic Kidney Disease.   . Anion gap 06/09/2016 7  5 - 15 Final  . TSH 06/09/2016 5.848* 0.350 - 4.500 uIU/mL Final    Assessment:  Terry Odonnell. is a 68 y.o. 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 He has used salves, creams, foams, and shampoo.  He was on Otezla (apremilast) x 30 days.  Psoriasis progressed.  He has had laser treatment.  Quantiferon gold, HIV testing, hepatitis C, and hepatitis B surface antigen were negative on 08/04/2018.  Symptomatically, he denies any acute  illnesses.  Exam reveals scalp psoriasis.  Plan: 1.   Discuss medical history, diagnosis and past management of psoriasis. 2.   Moderate to severe plaque psoriasis  Tildrakizumab SQ: 100 mg at weeks 0, 4, and then every 12 weeks thereafter.  Review potential side effects:  hypersensitivity reactions and infections (including TB).   Discuss role of infusion center and decision regarding treatment made by Dr. Nehemiah Massed. 3.   RTC in 4 weeks then every 12 weeks for Ilumya. 4.   RTC on 04/18/2019 for MD assessment and Ilumya.   Lequita Asal, MD  10/04/2018, 4:35 PM

## 2018-11-01 ENCOUNTER — Inpatient Hospital Stay: Payer: Medicare Other | Attending: Hematology and Oncology

## 2018-11-01 VITALS — BP 154/82 | HR 78 | Temp 96.0°F | Resp 20

## 2018-11-01 DIAGNOSIS — L400 Psoriasis vulgaris: Secondary | ICD-10-CM

## 2018-11-01 MED ORDER — TILDRAKIZUMAB-ASMN 100 MG/ML ~~LOC~~ SOSY
100.0000 mg | PREFILLED_SYRINGE | Freq: Once | SUBCUTANEOUS | Status: AC
  Administered 2018-11-01: 100 mg via SUBCUTANEOUS
  Filled 2018-11-01: qty 1

## 2019-01-23 ENCOUNTER — Other Ambulatory Visit: Payer: Self-pay

## 2019-01-24 ENCOUNTER — Inpatient Hospital Stay: Payer: Medicare Other | Attending: Hematology and Oncology

## 2019-01-24 VITALS — BP 148/74 | HR 75 | Temp 97.8°F | Resp 20

## 2019-01-24 DIAGNOSIS — L400 Psoriasis vulgaris: Secondary | ICD-10-CM | POA: Insufficient documentation

## 2019-01-24 MED ORDER — TILDRAKIZUMAB-ASMN 100 MG/ML ~~LOC~~ SOSY
100.0000 mg | PREFILLED_SYRINGE | Freq: Once | SUBCUTANEOUS | Status: AC
  Administered 2019-01-24: 15:00:00 100 mg via SUBCUTANEOUS
  Filled 2019-01-24: qty 1

## 2019-01-24 NOTE — Progress Notes (Signed)
Patient here for his ilumya injection. Feeling well. No issues or concerns. Received his injection as ordered.

## 2019-04-18 ENCOUNTER — Ambulatory Visit: Payer: Medicare Other

## 2019-04-18 ENCOUNTER — Ambulatory Visit: Payer: Medicare Other | Admitting: Hematology and Oncology

## 2019-05-07 ENCOUNTER — Ambulatory Visit: Admit: 2019-05-07 | Discharge: 2019-05-08 | Payer: MEDICARE | Attending: Dermatology | Primary: Dermatology

## 2019-05-07 DIAGNOSIS — R21 Rash and other nonspecific skin eruption: Principal | ICD-10-CM

## 2019-05-22 DIAGNOSIS — L409 Psoriasis, unspecified: Secondary | ICD-10-CM

## 2019-05-22 DIAGNOSIS — Z79899 Other long term (current) drug therapy: Secondary | ICD-10-CM

## 2019-05-22 DIAGNOSIS — Z1159 Encounter for screening for other viral diseases: Secondary | ICD-10-CM

## 2019-05-22 MED ORDER — CLOBETASOL 0.05 % TOPICAL OINTMENT
2 refills | 0 days | Status: CP
Start: 2019-05-22 — End: 2019-05-23

## 2019-05-22 MED ORDER — HUMIRA PEN CITRATE FREE 40 MG/0.4 ML
SUBCUTANEOUS | 3 refills | 84.00000 days | Status: CP
Start: 2019-05-22 — End: 2020-05-21

## 2019-05-22 MED ORDER — HUMIRA PEN CITRATE FREE STARTER PACK FOR PS/UV 1X 80 MG/0.8 ML, 2X 40 MG/0.4 ML
0 refills | 0 days | Status: CP
Start: 2019-05-22 — End: ?

## 2019-05-23 ENCOUNTER — Ambulatory Visit: Admit: 2019-05-23 | Discharge: 2019-05-24 | Payer: MEDICARE | Attending: Dermatology | Primary: Dermatology

## 2019-05-23 DIAGNOSIS — L409 Psoriasis, unspecified: Secondary | ICD-10-CM

## 2019-05-23 DIAGNOSIS — R21 Rash and other nonspecific skin eruption: Secondary | ICD-10-CM

## 2019-05-23 MED ORDER — BETAMETHASONE, AUGMENTED 0.05 % TOPICAL OINTMENT
2 refills | 0 days | Status: CP
Start: 2019-05-23 — End: ?

## 2019-07-11 ENCOUNTER — Ambulatory Visit: Payer: Medicare Other

## 2019-07-23 ENCOUNTER — Ambulatory Visit: Admit: 2019-07-23 | Discharge: 2019-07-24 | Payer: MEDICARE | Attending: Dermatology | Primary: Dermatology

## 2019-07-23 DIAGNOSIS — Z79899 Other long term (current) drug therapy: Principal | ICD-10-CM

## 2019-07-23 DIAGNOSIS — L409 Psoriasis, unspecified: Principal | ICD-10-CM

## 2019-08-14 DIAGNOSIS — L409 Psoriasis, unspecified: Principal | ICD-10-CM

## 2019-08-16 NOTE — Unmapped (Signed)
[facility] Specialty Medication Onboarding    Specialty Medication: ILUMYA  Prior Authorization: Approved   Financial Assistance: No - patient doesn't qualify for additional assistance   Final Copay/Day Supply: $3009.70 / 84 DAYS    Insurance Restrictions: None     Notes to Pharmacist:     The triage team has completed the benefits investigation and has determined that the patient is able to fill this medication at [facility]. Please contact the patient to complete the onboarding or follow up with the prescribing physician as needed.

## 2019-08-17 NOTE — Unmapped (Signed)
I left a voicemail with Oscar Weeks with [employer] [facility] [place] [phone]) to determine if Dr. Harrison Mons could send a prescription to them for Ilumya. When I spoke with the first representative, she indicated Oscar Weeks's BCBS Supplement had picked up the cost previously, which indicates Medical coverage is paying for this medication. She believed the clinic practiced buy and bill which would mean the medication was not coming through a specialty pharmacy.    I also asked if other injections could be processed this way, given Kaylem wasn't sure Ilumya was helping much.     Cigi Bega A. Katrinka Blazing, PharmD, BCPS - Pharmacist   [facility]

## 2019-08-17 NOTE — Unmapped (Signed)
I spoke with Oscar Weeks to relay that his Ilumya injection was approved by his Part D plan, but he is currently in the donut hole and his copay is >$3000.     He informed me that the infusion center was able to provide him with the medication at no charge previously, and indicated they might be billing his Medicare Part B (medical coverage).    It's unclear to me if they buy/bill at the infusion center, or if they were obtaining from an outside pharmacy providing the medication. I'll loop in [facility] dermatology to see if they know details, or we could also contact his infusion center to learn the process.    His last injection was in Nov, so will not need until Feb/March.    Levetta Bognar A. Katrinka Blazing, PharmD, BCPS - Pharmacist   [facility]

## 2019-08-22 DIAGNOSIS — Z79899 Other long term (current) drug therapy: Principal | ICD-10-CM

## 2019-08-22 NOTE — Unmapped (Signed)
Per insurance, will submit Ilumya refills directly to infusion center via [email or web address]

## 2019-08-23 NOTE — Unmapped (Signed)
Sent order for repeat quant gold to [facility]

## 2019-09-05 LAB — QUANTIFERON TB GOLD PLUS
QUANTIFERON MITOGEN VALUE: 7.41 [IU]/mL
QUANTIFERON NIL VALUE: 0.01 [IU]/mL

## 2019-09-05 LAB — QUANTIFERON NIL VALUE: Gamma interferon background:ACnc:Pt:Bld:Qn:IA: 0.01

## 2019-09-06 NOTE — Unmapped (Signed)
Notified patient of reassuring labs - negative quant gold    Continue with plan as previously discussed.

## 2019-09-06 NOTE — Unmapped (Signed)
Placed ilumya orders for [facility] in box to be faxed

## 2019-10-03 NOTE — Unmapped (Signed)
Received documentation of patient's recent Ilumya injection on 10/03/2019.  Scanned into media.  Tolerated well, no complications.  Next appointment 12/26/2019.

## 2019-10-25 NOTE — Unmapped (Signed)
This patient has been disenrolled from the [facility] specialty pharmacy services due to now receiving medication through clinic-administration. Per chart notes, Oscar Weeks received his dose in January. [name] Specialty Pharmacist

## 2020-01-21 ENCOUNTER — Ambulatory Visit: Admit: 2020-01-21 | Discharge: 2020-01-22 | Payer: MEDICARE | Attending: Dermatology | Primary: Dermatology

## 2020-01-21 DIAGNOSIS — L409 Psoriasis, unspecified: Principal | ICD-10-CM

## 2020-01-21 DIAGNOSIS — L820 Inflamed seborrheic keratosis: Principal | ICD-10-CM

## 2020-01-21 DIAGNOSIS — L570 Actinic keratosis: Principal | ICD-10-CM

## 2020-01-21 DIAGNOSIS — Z79899 Other long term (current) drug therapy: Principal | ICD-10-CM

## 2020-01-21 MED ORDER — IXEKIZUMAB 80 MG/ML SUBCUTANEOUS AUTO-INJECTOR
4 refills | 0 days | Status: CP
Start: 2020-01-21 — End: ?

## 2020-01-21 MED ORDER — TALTZ AUTOINJECTOR 80 MG/ML SUBCUTANEOUS
0 refills | 0 days | Status: CP
Start: 2020-01-21 — End: ?

## 2020-01-22 DIAGNOSIS — L409 Psoriasis, unspecified: Principal | ICD-10-CM

## 2020-02-05 NOTE — Unmapped (Signed)
[facility] Specialty Medication Onboarding    Specialty Medication: TALTZ 80MG  AUTO INJECTOR (LOADING DOSE)  Prior Authorization: Approved   Financial Assistance: No - patient doesn't qualify for additional assistance   Final Copay/Day Supply: $3015.20 / 28 DAYS    Insurance Restrictions: Yes - max 1 month supply     Notes to Pharmacist:     The triage team has completed the benefits investigation and has determined that the patient is able to fill this medication at [facility]. Please contact the patient to complete the onboarding or follow up with the prescribing physician as needed.        [facility] Specialty Medication Onboarding    Specialty Medication: TALTZ 80MG  AUTO INJECTOR (MAINTENANCE DOSE)  Prior Authorization: Approved   Financial Assistance: No - patient doesn't qualify for additional assistance   Final Copay/Day Supply: $1795.49 / 28 DAYS    Insurance Restrictions: Yes - max 1 month supply     Notes to Pharmacist:     The triage team has completed the benefits investigation and has determined that the patient is able to fill this medication at [facility]. Please contact the patient to complete the onboarding or follow up with the prescribing physician as needed.

## 2020-02-06 NOTE — Unmapped (Signed)
[facility] Pharmacist Intervention    Type of intervention: cost/coordination of care    Medication: Taltz    Problem: Altamease Oiler cost for first month is >$3,000 since the medication pushes patient into donut hole.    Intervention: I called to make sure Jeziel had been informed of this - he asked how much of a cost to expect moving forward, or what alternatives might exist. I advised he could speak with his insurance company to learn how big his donut hole responsibility is, and what his catastrophic copay would be (unfortunately the pharmacy isn't given those details).    I advised unfortunately, other biologics are likely to be high cost too since he has a part D plan.    I will also tag his dermatologist so she can consider other options.    Follow up needed: Will move call out a few weeks - it's unclear at this point it patient would want to start vs. Not.    Approximate time spent: 10 minutes    Salvatrice Morandi A Desiree Lucy [facility] Specialty Pharmacist

## 2020-02-28 ENCOUNTER — Encounter: Payer: Self-pay | Admitting: Emergency Medicine

## 2020-02-28 ENCOUNTER — Emergency Department: Payer: Medicare Other

## 2020-02-28 ENCOUNTER — Emergency Department
Admission: EM | Admit: 2020-02-28 | Discharge: 2020-02-28 | Disposition: A | Discharge status: Home / Self Care | Payer: Medicare Other | Attending: Student | Admitting: Student

## 2020-02-28 ENCOUNTER — Other Ambulatory Visit: Payer: Self-pay

## 2020-02-28 DIAGNOSIS — Z87891 Personal history of nicotine dependence: Secondary | ICD-10-CM | POA: Insufficient documentation

## 2020-02-28 DIAGNOSIS — I10 Essential (primary) hypertension: Secondary | ICD-10-CM | POA: Insufficient documentation

## 2020-02-28 DIAGNOSIS — N130 Hydronephrosis with ureteropelvic junction obstruction: Secondary | ICD-10-CM | POA: Diagnosis not present

## 2020-02-28 DIAGNOSIS — R1032 Left lower quadrant pain: Secondary | ICD-10-CM | POA: Diagnosis present

## 2020-02-28 DIAGNOSIS — R110 Nausea: Secondary | ICD-10-CM | POA: Insufficient documentation

## 2020-02-28 DIAGNOSIS — N201 Calculus of ureter: Secondary | ICD-10-CM | POA: Insufficient documentation

## 2020-02-28 DIAGNOSIS — Z85850 Personal history of malignant neoplasm of thyroid: Secondary | ICD-10-CM | POA: Diagnosis not present

## 2020-02-28 DIAGNOSIS — Z79899 Other long term (current) drug therapy: Secondary | ICD-10-CM | POA: Insufficient documentation

## 2020-02-28 DIAGNOSIS — N200 Calculus of kidney: Secondary | ICD-10-CM

## 2020-02-28 DIAGNOSIS — R109 Unspecified abdominal pain: Secondary | ICD-10-CM

## 2020-02-28 LAB — CBC WITH DIFFERENTIAL/PLATELET
Abs Immature Granulocytes: 0.04 10*3/uL (ref 0.00–0.07)
Basophils Absolute: 0.1 10*3/uL (ref 0.0–0.1)
Basophils Relative: 1 %
Eosinophils Absolute: 0.3 10*3/uL (ref 0.0–0.5)
Eosinophils Relative: 3 %
HCT: 47.7 % (ref 39.0–52.0)
Hemoglobin: 15.9 g/dL (ref 13.0–17.0)
Immature Granulocytes: 0 %
Lymphocytes Relative: 23 %
Lymphs Abs: 2.5 10*3/uL (ref 0.7–4.0)
MCH: 29.6 pg (ref 26.0–34.0)
MCHC: 33.3 g/dL (ref 30.0–36.0)
MCV: 88.7 fL (ref 80.0–100.0)
Monocytes Absolute: 0.6 10*3/uL (ref 0.1–1.0)
Monocytes Relative: 6 %
Neutro Abs: 7.2 10*3/uL (ref 1.7–7.7)
Neutrophils Relative %: 67 %
Platelets: 190 10*3/uL (ref 150–400)
RBC: 5.38 MIL/uL (ref 4.22–5.81)
RDW: 13.7 % (ref 11.5–15.5)
WBC: 10.7 10*3/uL — ABNORMAL HIGH (ref 4.0–10.5)
nRBC: 0 % (ref 0.0–0.2)

## 2020-02-28 LAB — COMPREHENSIVE METABOLIC PANEL
ALT: 43 U/L (ref 0–44)
AST: 27 U/L (ref 15–41)
Albumin: 4 g/dL (ref 3.5–5.0)
Alkaline Phosphatase: 42 U/L (ref 38–126)
Anion gap: 5 (ref 5–15)
BUN: 18 mg/dL (ref 8–23)
CO2: 27 mmol/L (ref 22–32)
Calcium: 9.4 mg/dL (ref 8.9–10.3)
Chloride: 106 mmol/L (ref 98–111)
Creatinine, Ser: 1.05 mg/dL (ref 0.61–1.24)
GFR calc Af Amer: >60 mL/min (ref >60)
GFR calc non Af Amer: >60 mL/min (ref >60)
Glucose, Bld: 117 mg/dL — ABNORMAL HIGH (ref 70–99)
Potassium: 3.7 mmol/L (ref 3.5–5.1)
Sodium: 138 mmol/L (ref 135–145)
Total Bilirubin: 1.1 mg/dL (ref 0.3–1.2)
Total Protein: 7.4 g/dL (ref 6.5–8.1)

## 2020-02-28 LAB — URINALYSIS, COMPLETE (UACMP) WITH MICROSCOPIC
Bilirubin Urine: NEGATIVE
Glucose, UA: NEGATIVE mg/dL
Ketones, ur: NEGATIVE mg/dL
Leukocytes,Ua: NEGATIVE
Nitrite: NEGATIVE
Protein, ur: 30 mg/dL — AB
RBC / HPF: >50 RBC/hpf — ABNORMAL HIGH (ref 0–5)
Specific Gravity, Urine: 1.014 (ref 1.005–1.030)
Squamous Epithelial / HPF: NONE SEEN (ref 0–5)
pH: 5 (ref 5.0–8.0)

## 2020-02-28 LAB — TROPONIN I (HIGH SENSITIVITY): Troponin I (High Sensitivity): 6 ng/L (ref <18)

## 2020-02-28 LAB — LIPASE, BLOOD: Lipase: 39 U/L (ref 11–51)

## 2020-02-28 MED ORDER — ONDANSETRON HCL 4 MG PO TABS: 4.0000 mg | ORAL_TABLET | Freq: Three times a day (TID) | ORAL | 0 refills | Status: AC | PRN

## 2020-02-28 MED ORDER — ONDANSETRON HCL 4 MG/2ML IJ SOLN
4.0000 mg | Freq: Once | INTRAMUSCULAR | Status: AC
  Administered 2020-02-28: 4 mg via INTRAVENOUS
  Filled 2020-02-28: qty 2

## 2020-02-28 MED ORDER — MORPHINE SULFATE (PF) 4 MG/ML IV SOLN
4.0000 mg | Freq: Once | INTRAVENOUS | Status: AC
  Administered 2020-02-28: 4 mg via INTRAVENOUS
  Filled 2020-02-28: qty 1

## 2020-02-28 MED ORDER — OXYCODONE HCL 5 MG PO TABS: 5.0000 mg | ORAL_TABLET | Freq: Four times a day (QID) | ORAL | 0 refills | Status: AC | PRN

## 2020-02-28 MED ORDER — KETOROLAC TROMETHAMINE 30 MG/ML IJ SOLN
15.0000 mg | Freq: Once | INTRAMUSCULAR | Status: AC
  Administered 2020-02-28: 15 mg via INTRAVENOUS
  Filled 2020-02-28: qty 1

## 2020-02-28 NOTE — ED Provider Notes (Signed)
[facility] Emergency Department Provider Note  ____________________________________________   First MD Initiated Contact with Patient 02/28/20 0745     (approximate)  I have reviewed the triage vital signs and the nursing notes.  History  Chief Complaint Abdominal Pain    HPI Terry Odonnell. is a 69 y.o. male w/ PMHx BPH, thyroid cancer, GERD, HTN, nephrolithiasis, who presents to the ER for L sided abdominal and flank pain. Symptoms started last night and have been constant since onset.  Located in the LUQ and L flank and radiates down into the groin.  Sharp, moderate to severe.  No alleviating or aggravating components.  Associated with nausea, but no vomiting.  No changes to bowel movements.  No fevers.  Does have a history of nephrolithiasis that has required lithotripsy previously.  Denies any obvious blood in the urine or dysuria.  Symptoms feel somewhat similar to prior episodes of nephrolithiasis, but states today symptoms are more sharp, severe.  Denies any chest pain or trouble breathing.   Past Medical Hx Past Medical History:  Diagnosis Date  . BPH (benign prostatic hyperplasia)   . 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 GERD (gastroesophageal reflux disease)   . Hypertension     Problem List Patient Active Problem List   Diagnosis Date Noted  . Plaque psoriasis 10/03/2018  . 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 APPENDECTOMY    . THYROIDECTOMY  2006    Medications Prior to Admission medications   Medication Sig Start Date End Date Taking? Authorizing Provider  Clobetasol Propionate Emulsion 0.05 % topical foam Apply topically 2 (two) times daily.  05/03/14   [provider]  levothyroxine (SYNTHROID, LEVOTHROID) 150 MCG tablet Take 150 mcg by mouth daily before breakfast.  12/13/14   [provider]    lisinopril-hydrochlorothiazide (PRINZIDE,ZESTORETIC) 20-12.5 MG tablet Take 1 tablet by mouth daily.  12/13/14   [provider]  omeprazole (PRILOSEC) 20 MG capsule Take 20 mg by mouth daily.  12/13/14   [provider]  sertraline (ZOLOFT) 50 MG tablet Take 50 mg by mouth daily.  12/13/14   [provider]  tamsulosin (FLOMAX) 0.4 MG CAPS capsule Take 0.4 mg by mouth daily.  12/13/14   [provider]    Allergies Chlorhexidine  Family Hx Family History  Problem Relation Age of Onset  . Lymphoma Father     Social Hx Social History   Tobacco Use  . Smoking status: Former Research scientist (life sciences)  . Smokeless tobacco: Never Used  Vaping Use  . Vaping Use: Never used  Substance Use Topics  . Alcohol use: Not on file  . Drug use: Not on file     Review of Systems  Constitutional: Negative for fever. Negative for chills. Eyes: Negative for visual changes. ENT: Negative for sore throat. Cardiovascular: Negative for chest pain. Respiratory: Negative for shortness of breath. Gastrointestinal: + nausea, abdominal pain, flank pain Genitourinary: Negative for dysuria. Musculoskeletal: Negative for leg swelling. Skin: Negative for rash. Neurological: Negative for headaches.   Physical Exam  Vital Signs: ED Triage Vitals  Enc Vitals Group     BP 02/28/20 0408 (!) 145/68     Pulse Rate 02/28/20 0408 63     Resp 02/28/20 0408 18     Temp 02/28/20 0408 98 F (36.7 C)     Temp Source 02/28/20 0408 Oral  SpO2 02/28/20 0408 97 %     Weight 02/28/20 0407 280 lb (127 kg)     Height 02/28/20 0407 5\' 11"  (1.803 m)     Head Circumference --      Peak Flow --      Pain Score 02/28/20 0407 8     Pain Loc --      Pain Edu? --      Excl. in [place]? --     Constitutional: Alert and oriented. Well appearing. NAD.  Head: Normocephalic. Atraumatic. Eyes: Conjunctivae clear. Sclera anicteric. Pupils equal and symmetric. Nose: No masses or lesions. No congestion or  rhinorrhea. Mouth/Throat: Wearing mask.  Neck: No stridor. Trachea midline.  Cardiovascular: Normal rate, regular rhythm. Extremities well perfused. Respiratory: Normal respiratory effort.  Lungs CTAB. Gastrointestinal: Soft. Non-distended.  TTP left-sided abdomen, no rebound/guarding/rigidity. Genitourinary: Deferred. Musculoskeletal: No lower extremity edema. No deformities. Neurologic:  Normal speech and language. No gross focal or lateralizing neurologic deficits are appreciated.  Skin: Skin is warm, dry and intact. No rash noted. Psychiatric: Mood and affect are appropriate for situation.  EKG  Personally reviewed and interpreted by myself.   Date: 02/28/2020 Time: 0413 Rate: 63 Rhythm: sinus Axis: normal Intervals: WNL No acute ischemic changes No acute arrhythmias No STEMI    Radiology  Personally reviewed available imaging myself.   CT Renal - IMPRESSION:  Mild left hydronephrosis due to a 0.3 cm ureteral stone just below  the pelvic brim. No other urinary tract stones or acute abnormality  identified.   Diffuse fatty infiltration of the liver.   Aortic Atherosclerosis (ICD10-I70.0).    Procedures  Procedure(s) performed (including critical care):  Procedures   Initial Impression / Assessment and Plan / MDM / ED Course  69 y.o. male who presents to the ED for left-sided abdominal and flank pain, radiating to the groin.  History nephrolithiasis.  Ddx: nephroureterolithiasis, GERD, gastritis, colitis, atypical ACS  Will plan for labs, imaging, pain control and reassess  CT imaging reveals a left-sided kidney stone, 0.3 cm.  No other acute abnormalities.  UA consistent with stone, no evidence of superimposed infection, LE and nitrite negative.  Normal creatinine.  Patient feeling improved after pain medication.  As such, feel he is stable for discharge with outpatient follow-up.  Will provide referral to urology, and Rx for pain and nausea control.   Patient and wife at bedside voiced understanding and are comfortable with the plan and discharge.  Given return precautions.   _______________________________   As part of my medical decision making I have reviewed available labs, radiology tests, reviewed old records/performed chart review.    Final Clinical Impression(s) / ED Diagnosis  Final diagnoses:  Left sided abdominal pain  Left flank pain  Kidney stone on left side       Note:  This document was prepared using Dragon voice recognition software and may include unintentional dictation errors.   Lilia Pro., MD 02/28/20 [phone]

## 2020-02-28 NOTE — Discharge Instructions (Addendum)
Thank you for letting us take care of you in the emergency department today. Your work up has shown that you have a kidney stone.   Please continue to take any regular, prescribed medications.   New medications we have prescribed:  - Oxycodone - pain medication to take for pain you cannot control first with ibuprofen or Tylenol. Do not take and drive/operate heavy machinery/drink alcohol while taking this medicine. - Zofran - for nausea, as needed  Please follow up with: - Urology doctor - to follow up on your kidney stone. Information for one is below.   Please return to the ER for any new or worsening symptoms.

## 2020-02-28 NOTE — ED Notes (Signed)
Patient transported to CT 

## 2020-02-28 NOTE — ED Triage Notes (Signed)
Patient ambulatory to triage with steady gait, without difficulty or distress noted; pt reports since 930pm having pain to entire left side; pt is pointing to left upper abd radiating down side; denies any accomp symptoms, denies hx of same

## 2020-03-03 ENCOUNTER — Ambulatory Visit
Admit: 2020-03-03 | Discharge: 2020-03-04 | Payer: MEDICARE | Attending: Student in an Organized Health Care Education/Training Program | Primary: Student in an Organized Health Care Education/Training Program

## 2020-03-03 DIAGNOSIS — L814 Other melanin hyperpigmentation: Principal | ICD-10-CM

## 2020-03-03 DIAGNOSIS — D1801 Hemangioma of skin and subcutaneous tissue: Principal | ICD-10-CM

## 2020-03-03 DIAGNOSIS — L820 Inflamed seborrheic keratosis: Principal | ICD-10-CM

## 2020-03-03 DIAGNOSIS — L409 Psoriasis, unspecified: Principal | ICD-10-CM

## 2020-03-03 DIAGNOSIS — Z79899 Other long term (current) drug therapy: Principal | ICD-10-CM

## 2020-03-03 DIAGNOSIS — L570 Actinic keratosis: Principal | ICD-10-CM

## 2020-03-03 DIAGNOSIS — L821 Other seborrheic keratosis: Principal | ICD-10-CM

## 2020-03-03 DIAGNOSIS — D229 Melanocytic nevi, unspecified: Principal | ICD-10-CM

## 2020-03-03 NOTE — Unmapped (Addendum)
We will on getting the light treatment approved.    Continue Ilumya for now.        Cryosurgery    Cryosurgery (???freezing???) uses liquid nitrogen to destroy certain types of skin lesions. Lowering the temperature of the lesion in a small area surrounding skin destroys the lesion. Immediately following cryosurgery, you will notice redness and swelling of the treatment area. Blistering or weeping may occur, lasting approximately one week which will then be followed by crusting. Most areas will heal completely in 10 to 14 days.    Wash the treated areas daily. Allow soap and water to run over the areas, but do not scrub. Should a scab or crust form, allow it to fall off on its own. Do not remove or pick at it. Application of an ointment  and a bandage may make you feel more comfortable, but it is not necessary. Some people develop an allergy to Neosporin, so we recommend that Vaseline or  Aquaphor be used.    The cryotherapy site will be more sensitive than your surrounding skin. Keep it covered, and remember to apply sunscreen every day to all your sun exposed skin. A scar may remain which is lighter or pinker than your normal skin. Your body will continue to improve your scar for up to one year; however a light-colored scar may remain.    Infection following cryotherapy is rare. However if you are worried about the appearance of the treated area, contact your doctor. We have a physician on call at all times. If you have any concerns about the site, please call our clinic at [phone]

## 2020-03-03 NOTE — Unmapped (Signed)
Dermatology Note     Assessment and Plan:      Benign Lesions/ Findings:   Angioma(s)  Lentigo/Lentigines  Nevus/Nevi-Benign Appearing  Seborrheic Keratosis(es) - irritation of some noted  - Reassurance provided regarding the benign appearance of lesions noted on exam today; no treatment is indicated in the absence of symptoms/changes.  - Reinforced importance of photoprotective strategies including liberal and frequent sunscreen use of a broad-spectrum SPF 30 or greater, use of protective clothing, and sun avoidance for prevention of cutaneous malignancy and photoaging.  Counseled patient on the importance of regular self-skin monitoring as well as routine clinical skin examinations as scheduled.     Actinic Keratosis(es):   - Discussed premalignant nature of lesions and therefore recommended therapy.   - Patient agrees to cryotherapy. See procedure note for details.  Cryotherapy Procedure Note:   After R/B/A discussed (including scarring, pigment alteration, recurrence, or persistence of the lesion) and verbal consent was obtained, identified lesions were treated with liquid nitrogen x 1 ten second freeze-thaw cycle. The patient tolerated the procedure well and was instructed on post-procedure care.  Total AK's frozen: 6  Location and number: face x 6    Irritated/Inflamed Seborrheic Keratosis(es):   - Discussed benign nature of lesions, but due to symptoms, offered treatment with cryotherapy.  - Patient agrees to cryotherapy. See procedure note for details.  Cryotherapy Procedure Note:   After R/B/A discussed (including scarring, pigment alteration, recurrence, or persistence of the lesion) and verbal consent was obtained, identified lesions were treated with liquid nitrogen x 1-2 ten second freeze-thaw cycle. The patient tolerated the procedure well and was instructed on post-procedure care.  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 Anti-TNFs (Oscar Weeks) and IL-17 inhibitors (Oscar Weeks) have been cost-prohibitive due to excessive copays or insurance denial. Does not qualify for medication assistance given income threshold.  - Reviewed Methotrexate including R/B/A, but patient hesitant to try without giving home nbUVB device a trial.  - Patient requests to continue Oscar Weeks and superpotent TCS for now while attempting trial of home nbUVB device. He states he needs additional information from our office for approval, which we will work on obtaining and sending to appropriate parties. We will plan for 1 year of treatment with end date 03/12/2021.  - Continue Oscar Weeks topically to scalp and T-Gel and/or T-Sal shampoo PRN.    The patient was advised to call for an appointment should any new, changing, or symptomatic lesions develop.     RTC: Return in about 3 months (around 06/03/2020) for psoriasis. or sooner as needed   _________________________________________________________________      Chief Complaint     Chief Complaint   Patient presents with   ??? Skin Check     FBSE no main concerns        HPI     Oscar Weeks is a 69 y.o. male who presents as a returning patient (last seen 01/21/2020) to [facility] for a full body skin exam. Patient reports a lesion of concern.     Lesions of concern:  - Lesion on the L jawline present for a while and irritated by shaving. Itchy. No pain or bleeding. No prior treatments or biopsies.    The patient denies any other new or changing lesions or areas of concern.     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 IL-17 inhibitors have not yet been tried and have a favorable side effect profile.    - Will attempt to send Oscar Weeks through insurance to see if targeting another pathway can achieve some better control.   - if this is not attainable, could consider methotrexate as alternative  - he did light or perhaps laser therapy at prior dermatologist and found it somewhat helpful. Our nbUVB unit remains closed d/t covid, so will attempt to obtain home nbUVB device.  - Continue Oscar Weeks topically to scalp, as well as t-gel and t-sal shampoo PRN  - he declines intralesional kenalog to thicker plaques today      Family History:   Negative for melanoma    Past Medical History, Family History, Social History, Medication List, Allergies, and Problem List were reviewed in the rooming section of Epic.     ROS: Other than symptoms mentioned in the HPI, no fevers, chills, or other skin complaints    Physical Examination     GENERAL: Well-appearing Fitzpatrick skin type II male in no acute distress, resting comfortably.  NEURO: Alert and oriented, answers questions appropriately  PSYCH: Normal mood and affect  SKIN (Full Skin Exam): Examination of the face, eyelids, lips, nose, ears, neck, chest, abdomen, back, arms, legs, hands, feet, palms, soles, nails, including scalp was performed  - Actinic Keratosis(es): Scaly erythematous macule(s) on the face  - Angioma(s): Scattered red vascular papule(s) on the trunk  - Lentigo/lentigines: Scattered pigmented macules that are tan to brown in color and are somewhat non-uniform in shape and concentrated in the sun-exposed areas of the face and upper extremities  - Nevus/nevi: Scattered well-demarcated, regular, pigmented macule(s) and/or papule(s) on the face and trunk  - Seborrheic Keratosis(es): Stuck-on appearing keratotic papule(s) on the trunk and face, one on L jaw irritated with redness, crusting, edema, and/or partial avulsion  - Diffuse erythematous plaques with micaceous scale encompassing near entirety of scalp. Total BSA involvement 10%    All areas not commented on are within normal limits or unremarkable      (Approved Template 02/01/2020)

## 2020-03-12 NOTE — Unmapped (Signed)
This patient has been disenrolled from the [facility] specialty pharmacy services due to therapy change due to cost. Per last dermatology notes, plan is to continue on [place]um[facility] (patient can get through part B). Will remove from [facility] call list for now. [facility] Special[facility] Pharmacist

## 2020-03-20 MED ORDER — OMEPRAZOLE 20 MG CAPSULE,DELAYED RELEASE
Freq: Every day | ORAL | 0.00000 days
Start: 2020-03-20 — End: ?

## 2020-04-02 ENCOUNTER — Ambulatory Visit (INDEPENDENT_AMBULATORY_CARE_PROVIDER_SITE_OTHER): Payer: Medicare Other | Admitting: Urology

## 2020-04-02 ENCOUNTER — Other Ambulatory Visit: Payer: Self-pay

## 2020-04-02 ENCOUNTER — Encounter: Payer: Self-pay | Admitting: Urology

## 2020-04-02 VITALS — BP 172/68 | HR 75 | Ht 70.0 in | Wt 290.0 lb

## 2020-04-02 DIAGNOSIS — N200 Calculus of kidney: Secondary | ICD-10-CM

## 2020-04-02 NOTE — Progress Notes (Signed)
° °  04/02/20 10:59 AM   Terry Odonnell. 09-25-1950 [id number]  CC: Left ureteral stone  HPI: I saw Terry Odonnell in urology clinic today for evaluation of nephrolithiasis.  He is a 69 year old male with morbid obesity and BMI of 42 and BPH who was seen in the ER on 02/28/2020 with severe left renal colic.  CT showed a 3 mm left mid ureteral stone.  This was likely uric acid based on the density of 450HU and urine pH of 5.  He reports he had a little bit more pain about a week after his ER visit, but has not had any problems since then.  He denies any gross hematuria or urinary symptoms.  He has not seen a stone pass.  He has a long history of kidney stones that required lithotripsy and ureteroscopy in the past.   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 He has never used smokeless tobacco. No history on file for alcohol use and drug use.  Physical Exam: BP (!) 172/68    Pulse 75    Ht 5\' 10"  (1.778 m)    Wt 290 lb (131.5 kg)    BMI 41.61 kg/m    Constitutional:  Alert and oriented, No acute distress. Cardiovascular: No clubbing, cyanosis, or edema. Respiratory: Normal respiratory effort, no increased work of breathing. GI: Abdomen is soft, nontender, nondistended, no abdominal masses GU: No CVA tenderness  Laboratory Data: Reviewed see HPI  Pertinent Imaging: I have personally reviewed the CT stone protocol from June showing a 3 mm left mid ureteral stone, likely uric acid based on 450hu and urine pH of 5.  Assessment & Plan:   He is a 69 year old male with a long  history of kidney stones, and likely spontaneously passed 3 mm left ureteral stone.  Based on density of 450hu and urine pH of 5, likely uric acid.  We discussed general stone prevention strategies including adequate hydration with goal of producing 2.5 L of urine daily, increasing citric acid intake, increasing calcium intake during high oxalate meals, minimizing animal protein, and decreasing salt intake. Information about dietary recommendations given today.   -Renal ultrasound to confirm stone passage, call with results -Recommend litholyte packets 1-2 times daily to alkalinize urine and prevent uric acid stones.  We also discussed decreasing meats in the diet, and increasing hydration -RTC 1 year with urinalysis to confirm alkalinization of urine, consider potassium citrate if persistently acidic urine  Nickolas Madrid, MD 04/02/2020

## 2020-04-02 NOTE — Patient Instructions (Addendum)
1. Increase fluid intake to prevent kidney stones 2. Take litholyte 1-2 packets daily to prevent stones 3. Decrease meat consumption  Dietary Guidelines to Help Prevent Kidney Stones Kidney stones are deposits of minerals and salts that form inside your kidneys. Your risk of developing kidney stones may be greater depending on your diet, your lifestyle, the medicines you take, and whether you have certain medical conditions. Most people can reduce their chances of developing kidney stones by following the instructions below. Depending on your overall health and the type of kidney stones you tend to develop, your dietitian may give you more specific instructions. What are tips for following this plan? Reading food labels  Choose foods with "no salt added" or "low-salt" labels. Limit your sodium intake to less than 1500 mg per day.  Choose foods with calcium for each meal and snack. Try to eat about 300 mg of calcium at each meal. Foods that contain 200-500 mg of calcium per serving include: ? 8 oz (237 ml) of milk, fortified nondairy milk, and fortified fruit juice. ? 8 oz (237 ml) of kefir, yogurt, and soy yogurt. ? 4 oz (118 ml) of tofu. ? 1 oz of cheese. ? 1 cup (300 g) of dried figs. ? 1 cup (91 g) of cooked broccoli. ? 1-3 oz can of sardines or mackerel.  Most people need 1000 to 1500 mg of calcium each day. Talk to your dietitian about how much calcium is recommended for you. Shopping  Buy plenty of fresh fruits and vegetables. Most people do not need to avoid fruits and vegetables, even if they contain nutrients that may contribute to kidney stones.  When shopping for convenience foods, choose: ? Whole pieces of fruit. ? Premade salads with dressing on the side. ? Low-fat fruit and yogurt smoothies.  Avoid buying frozen meals or prepared deli foods.  Look for foods with live cultures, such as yogurt and kefir. Cooking  Do not add salt to food when cooking. Place a salt shaker  on the table and allow each person to add his or her own salt to taste.  Use vegetable protein, such as beans, textured vegetable protein (TVP), or tofu instead of meat in pasta, casseroles, and soups. Meal planning   Eat less salt, if told by your dietitian. To do this: ? Avoid eating processed or premade food. ? Avoid eating fast food.  Eat less animal protein, including cheese, meat, poultry, or fish, if told by your dietitian. To do this: ? Limit the number of times you have meat, poultry, fish, or cheese each week. Eat a diet free of meat at least 2 days a week. ? Eat only one serving each day of meat, poultry, fish, or seafood. ? When you prepare animal protein, cut pieces into small portion sizes. For most meat and fish, one serving is about the size of one deck of cards.  Eat at least 5 servings of fresh fruits and vegetables each day. To do this: ? Keep fruits and vegetables on hand for snacks. ? Eat 1 piece of fruit or a handful of berries with breakfast. ? Have a salad and fruit at lunch. ? Have two kinds of vegetables at dinner.  Limit foods that are high in a substance called oxalate. These include: ? Spinach. ? Rhubarb. ? Beets. ? Potato chips and french fries. ? Nuts.  If you regularly take a diuretic medicine, make sure to eat at least 1-2 fruits or vegetables high in potassium each day.  These include: ? Avocado. ? Banana. ? Orange, prune, carrot, or tomato juice. ? Baked potato. ? Cabbage. ? Beans and split peas. General instructions   Drink enough fluid to keep your urine clear or pale yellow. This is the most important thing you can do.  Talk to your health care provider and dietitian about taking daily supplements. Depending on your health and the cause of your kidney stones, you may be advised: ? Not to take supplements with vitamin C. ? To take a calcium supplement. ? To take a daily probiotic supplement. ? To take other supplements such as magnesium,  fish oil, or vitamin B6.  Take all medicines and supplements as told by your health care provider.  Limit alcohol intake to no more than 1 drink a day for nonpregnant women and 2 drinks a day for men. One drink equals 12 oz of beer, 5 oz of wine, or 1 oz of hard liquor.  Lose weight if told by your health care provider. Work with your dietitian to find strategies and an eating plan that works best for you. What foods are not recommended? Limit your intake of the following foods, or as told by your dietitian. Talk to your dietitian about specific foods you should avoid based on the type of kidney stones and your overall health. Grains Breads. Bagels. Rolls. Baked goods. Salted crackers. Cereal. Pasta. Vegetables Spinach. Rhubarb. Beets. Canned vegetables. Angie Fava. Olives. Meats and other protein foods Nuts. Nut butters. Large portions of meat, poultry, or fish. Salted or cured meats. Deli meats. Hot dogs. Sausages. Dairy Cheese. Beverages Regular soft drinks. Regular vegetable juice. Seasonings and other foods Seasoning blends with salt. Salad dressings. Canned soups. Soy sauce. Ketchup. Barbecue sauce. Canned pasta sauce. Casseroles. Pizza. Lasagna. Frozen meals. Potato chips. [place] fries. Summary  You can reduce your risk of kidney stones by making changes to your diet.  The most important thing you can do is drink enough fluid. You should drink enough fluid to keep your urine clear or pale yellow.  Ask your health care provider or dietitian how much protein from animal sources you should eat each day, and also how much salt and calcium you should have each day. This information is not intended to replace advice given to you by your health care provider. Make sure you discuss any questions you have with your health care provider. Document Revised: 12/20/2018 Document Reviewed: 08/10/2016 Elsevier Patient Education  2020 [employer].

## 2020-05-05 ENCOUNTER — Other Ambulatory Visit: Payer: Self-pay

## 2020-05-05 ENCOUNTER — Ambulatory Visit
Admission: RE | Admit: 2020-05-05 | Discharge: 2020-05-05 | Disposition: A | Discharge status: Home / Self Care | Payer: Medicare Other | Source: Ambulatory Visit | Attending: Urology | Admitting: Urology

## 2020-05-05 DIAGNOSIS — N200 Calculus of kidney: Secondary | ICD-10-CM | POA: Insufficient documentation

## 2020-05-06 ENCOUNTER — Telehealth: Payer: Self-pay

## 2020-05-06 NOTE — Telephone Encounter (Signed)
-----   Message from Billey Co, MD sent at 05/06/2020  2:25 PM EDT ----- Kidney US shows passage of stone with no residual swelling, keep follow up one year as scheduled  Nickolas Madrid, MD 05/06/2020

## 2020-05-06 NOTE — Telephone Encounter (Signed)
Called pt informed him of the information below. Pt gave verbal understanding.  

## 2020-05-16 MED ORDER — LEVOTHYROXINE 25 MCG TABLET
0.00000 days
Start: 2020-05-16 — End: ?

## 2020-05-29 ENCOUNTER — Other Ambulatory Visit: Payer: Self-pay

## 2020-05-29 ENCOUNTER — Other Ambulatory Visit
Admission: RE | Admit: 2020-05-29 | Discharge: 2020-05-29 | Disposition: A | Discharge status: Home / Self Care | Payer: Medicare Other | Source: Ambulatory Visit | Attending: Gastroenterology | Admitting: Gastroenterology

## 2020-05-29 DIAGNOSIS — Z01812 Encounter for preprocedural laboratory examination: Secondary | ICD-10-CM | POA: Diagnosis present

## 2020-05-29 DIAGNOSIS — Z20822 Contact with and (suspected) exposure to covid-19: Secondary | ICD-10-CM | POA: Diagnosis not present

## 2020-05-30 ENCOUNTER — Other Ambulatory Visit: Payer: Medicare Other

## 2020-05-30 ENCOUNTER — Encounter: Payer: Self-pay | Admitting: *Deleted

## 2020-05-30 LAB — SARS CORONAVIRUS 2 (TAT 6-24 HRS): SARS Coronavirus 2: NEGATIVE

## 2020-06-02 ENCOUNTER — Ambulatory Visit: Payer: Medicare Other | Admitting: Anesthesiology

## 2020-06-02 ENCOUNTER — Encounter
Admission: RE | Disposition: A | Discharge status: Home / Self Care | Payer: Self-pay | Source: Home / Self Care | Attending: Gastroenterology

## 2020-06-02 ENCOUNTER — Other Ambulatory Visit: Payer: Self-pay

## 2020-06-02 ENCOUNTER — Ambulatory Visit
Admission: RE | Admit: 2020-06-02 | Discharge: 2020-06-02 | Disposition: A | Discharge status: Home / Self Care | Payer: Medicare Other | Attending: Gastroenterology | Admitting: Gastroenterology

## 2020-06-02 ENCOUNTER — Encounter: Payer: Self-pay | Admitting: Anesthesiology

## 2020-06-02 DIAGNOSIS — Z79899 Other long term (current) drug therapy: Secondary | ICD-10-CM | POA: Insufficient documentation

## 2020-06-02 DIAGNOSIS — M1990 Unspecified osteoarthritis, unspecified site: Secondary | ICD-10-CM | POA: Diagnosis not present

## 2020-06-02 DIAGNOSIS — K5730 Diverticulosis of large intestine without perforation or abscess without bleeding: Secondary | ICD-10-CM | POA: Insufficient documentation

## 2020-06-02 DIAGNOSIS — M4800 Spinal stenosis, site unspecified: Secondary | ICD-10-CM | POA: Insufficient documentation

## 2020-06-02 DIAGNOSIS — N400 Enlarged prostate without lower urinary tract symptoms: Secondary | ICD-10-CM | POA: Insufficient documentation

## 2020-06-02 DIAGNOSIS — F419 Anxiety disorder, unspecified: Secondary | ICD-10-CM | POA: Insufficient documentation

## 2020-06-02 DIAGNOSIS — Z1211 Encounter for screening for malignant neoplasm of colon: Secondary | ICD-10-CM | POA: Diagnosis present

## 2020-06-02 DIAGNOSIS — Z86010 Personal history of colonic polyps: Secondary | ICD-10-CM | POA: Diagnosis not present

## 2020-06-02 DIAGNOSIS — K640 First degree hemorrhoids: Secondary | ICD-10-CM | POA: Diagnosis not present

## 2020-06-02 DIAGNOSIS — I10 Essential (primary) hypertension: Secondary | ICD-10-CM | POA: Insufficient documentation

## 2020-06-02 DIAGNOSIS — Z888 Allergy status to other drugs, medicaments and biological substances status: Secondary | ICD-10-CM | POA: Diagnosis not present

## 2020-06-02 DIAGNOSIS — F329 Major depressive disorder, single episode, unspecified: Secondary | ICD-10-CM | POA: Insufficient documentation

## 2020-06-02 DIAGNOSIS — Z6841 Body Mass Index (BMI) 40.0 and over, adult: Secondary | ICD-10-CM | POA: Diagnosis not present

## 2020-06-02 DIAGNOSIS — E039 Hypothyroidism, unspecified: Secondary | ICD-10-CM | POA: Diagnosis not present

## 2020-06-02 DIAGNOSIS — K219 Gastro-esophageal reflux disease without esophagitis: Secondary | ICD-10-CM | POA: Diagnosis not present

## 2020-06-02 DIAGNOSIS — G4730 Sleep apnea, unspecified: Secondary | ICD-10-CM | POA: Diagnosis not present

## 2020-06-02 DIAGNOSIS — Z85850 Personal history of malignant neoplasm of thyroid: Secondary | ICD-10-CM | POA: Diagnosis not present

## 2020-06-02 HISTORY — DX: Anxiety disorder, unspecified: F41.9

## 2020-06-02 HISTORY — DX: Personal history of diseases of the skin and subcutaneous tissue: Z87.2

## 2020-06-02 HISTORY — DX: Hypothyroidism, unspecified: E03.9

## 2020-06-02 HISTORY — DX: Personal history of malignant neoplasm of thyroid: Z85.850

## 2020-06-02 HISTORY — DX: Depression, unspecified: F32.A

## 2020-06-02 HISTORY — DX: Unspecified osteoarthritis, unspecified site: M19.90

## 2020-06-02 HISTORY — DX: Spinal stenosis, site unspecified: M48.00

## 2020-06-02 HISTORY — PX: COLONOSCOPY: SHX5424

## 2020-06-02 HISTORY — DX: Sleep apnea, unspecified: G47.30

## 2020-06-02 SURGERY — COLONOSCOPY
Anesthesia: General

## 2020-06-02 MED ORDER — PROPOFOL 500 MG/50ML IV EMUL
INTRAVENOUS | Status: DC | PRN
  Administered 2020-06-02: 150 ug/kg/min via INTRAVENOUS

## 2020-06-02 MED ORDER — PROPOFOL 500 MG/50ML IV EMUL
INTRAVENOUS | Status: AC
  Filled 2020-06-02: qty 50

## 2020-06-02 MED ORDER — SODIUM CHLORIDE 0.9 % IV SOLN
INTRAVENOUS | Status: DC
  Administered 2020-06-02: 20 mL/h via INTRAVENOUS

## 2020-06-02 NOTE — Op Note (Signed)
[facility] Patient Name: Terry Odonnell Procedure Date: 06/02/2020 8:58 AM MRN: [id number] Account #: [id number] Date of Birth: 1951-07-18 Admit Type: Outpatient Age: 69 Room: [facility] END[facility] ROOM 1 Gender: Male Note Status: Finalized Procedure:             Colonoscopy Indications:           Surveillance: Personal history of adenomatous polyps                         on last colonoscopy > 5 years ago Providers:             Andrey Farmer MD, MD Referring MD:          Tracie Harrier, MD (Referring MD) Medicines:             Monitored Anesthesia Care Complications:         No immediate complications. Procedure:             Pre-Anesthesia Assessment:                        - Prior to the procedure, a History and Physical was                         performed, and patient medications and allergies were                         reviewed. The patient is competent. The risks and                         benefits of the procedure and the sedation options and                         risks were discussed with the patient. All questions                         were answered and informed consent was obtained.                         Patient identification and proposed procedure were                         verified by the physician, the nurse, the anesthetist                         and the technician in the endoscopy suite. Mental                         Status Examination: alert and oriented. Airway                         Examination: normal oropharyngeal airway and neck                         mobility. Respiratory Examination: clear to                         auscultation. CV Examination: normal. Prophylactic  Antibiotics: The patient does not require prophylactic                         antibiotics. Prior Anticoagulants: The patient has                         taken no previous anticoagulant or antiplatelet                          agents. ASA Grade Assessment: III - A patient with                         severe systemic disease. After reviewing the risks and                         benefits, the patient was deemed in satisfactory                         condition to undergo the procedure. The anesthesia                         plan was to use monitored anesthesia care (MAC).                         Immediately prior to administration of medications,                         the patient was re-assessed for adequacy to receive                         sedatives. The heart rate, respiratory rate, oxygen                         saturations, blood pressure, adequacy of pulmonary                         ventilation, and response to care were monitored                         throughout the procedure. The physical status of the                         patient was re-assessed after the procedure.                        After obtaining informed consent, the colonoscope was                         passed under direct vision. Throughout the procedure,                         the patient's blood pressure, pulse, and oxygen                         saturations were monitored continuously. The                         Colonoscope was introduced through the anus and  advanced to the the cecum, identified by appendiceal                         orifice and ileocecal valve. The colonoscopy was                         performed without difficulty. The patient tolerated                         the procedure well. The quality of the bowel                         preparation was good. Findings:      The perianal and digital rectal examinations were normal.      A single small-mouthed diverticulum was found in the sigmoid colon.      Non-bleeding internal hemorrhoids were found during retroflexion. The       hemorrhoids were Grade I (internal hemorrhoids that do not prolapse).      The exam was otherwise without  abnormality on direct and retroflexion       views. Impression:            - Diverticulosis in the sigmoid colon.                        - Non-bleeding internal hemorrhoids.                        - The examination was otherwise normal on direct and                         retroflexion views.                        - No specimens collected. Recommendation:        - Discharge patient to home.                        - Resume previous diet.                        - Continue present medications.                        - Repeat colonoscopy in 10 years for surveillance.                        - Return to referring physician as previously                         scheduled. Procedure Code(s):     --- Professional ---                        D6222, Colorectal cancer screening; colonoscopy on                         individual at high risk Diagnosis Code(s):     --- Professional ---                        Z86.010, Personal history of colonic polyps  K64.0, First degree hemorrhoids                        K57.30, Diverticulosis of large intestine without                         perforation or abscess without bleeding CPT copyright 2019 American Medical Association. All rights reserved. The codes documented in this report are preliminary and upon coder review may  be revised to meet current compliance requirements. Andrey Farmer, MD Andrey Farmer MD, MD 06/02/2020 9:25:00 AM Number of Addenda: 0 Note Initiated On: 06/02/2020 8:58 AM Scope Withdrawal Time: 0 hours 10 minutes 43 seconds  Total Procedure Duration: 0 hours 13 minutes 17 seconds  Estimated Blood Loss:  Estimated blood loss: none.      [facility]

## 2020-06-02 NOTE — Anesthesia Postprocedure Evaluation (Signed)
Anesthesia Post Note  Patient: Terry Odonnell.  Procedure(s) Performed: COLONOSCOPY (N/A )  Patient location during evaluation: Endoscopy Anesthesia Type: General Level of consciousness: awake and alert Pain management: pain level controlled Vital Signs Assessment: post-procedure vital signs reviewed and stable Respiratory status: spontaneous breathing, nonlabored ventilation, respiratory function stable and patient connected to nasal cannula oxygen Cardiovascular status: blood pressure returned to baseline and stable Postop Assessment: no apparent nausea or vomiting Anesthetic complications: no   No complications documented.   Last Vitals:  Vitals:   06/02/20 0945 06/02/20 0955  BP: 140/83 135/82  Pulse: 71 67  Resp: (!) 21 19  Temp:    SpO2: 95% 95%    Last Pain:  Vitals:   06/02/20 0955  TempSrc:   PainSc: 0-No pain                 Arita Miss

## 2020-06-02 NOTE — Transfer of Care (Signed)
Immediate Anesthesia Transfer of Care Note  Patient: Terry Odonnell.  Procedure(s) Performed: COLONOSCOPY (N/A )  Patient Location: PACU  Anesthesia Type:General  Level of Consciousness: awake and sedated  Airway & Oxygen Therapy: Patient Spontanous Breathing and Patient connected to face mask oxygen  Post-op Assessment: Report given to RN and Post -op Vital signs reviewed and stable  Post vital signs: Reviewed and stable  Last Vitals:  Vitals Value Taken Time  BP    Temp    Pulse    Resp    SpO2      Last Pain:  Vitals:   06/02/20 0835  TempSrc: Temporal  PainSc: 0-No pain         Complications: No complications documented.

## 2020-06-02 NOTE — Interval H&P Note (Signed)
History and Physical Interval Note:  06/02/2020 8:48 AM  Terry Odonnell.  has presented today for surgery, with the diagnosis of PERSONAL HX.OF COLON POLYPS.  The various methods of treatment have been discussed with the patient and family. After consideration of risks, benefits and other options for treatment, the patient has consented to  Procedure(s): COLONOSCOPY (N/A) as a surgical intervention.  The patient's history has been reviewed, patient examined, no change in status, stable for surgery.  I have reviewed the patient's chart and labs.  Questions were answered to the patient's satisfaction.     Lesly Rubenstein  Ok to proceed with colonoscopy

## 2020-06-02 NOTE — Anesthesia Preprocedure Evaluation (Signed)
Anesthesia Evaluation  Patient identified by MRN, date of birth, ID band Patient awake    Reviewed: Allergy & Precautions, NPO status , Patient's Chart, lab work & pertinent test results  History of Anesthesia Complications Negative for: history of anesthetic complications  Airway Mallampati: III  TM Distance: >3 FB Neck ROM: Full    Dental  (+) Teeth Intact   Pulmonary sleep apnea and Continuous Positive Airway Pressure Ventilation , neg COPD, Patient abstained from smoking.Not current smoker,    Pulmonary exam normal breath sounds clear to auscultation       Cardiovascular Exercise Tolerance: Good METShypertension, (-) CAD and (-) Past MI (-) dysrhythmias  Rhythm:Regular Rate:Normal - Systolic murmurs    Neuro/Psych PSYCHIATRIC DISORDERS Anxiety Depression negative neurological ROS     GI/Hepatic GERD  Medicated and Controlled,(+)     (-) substance abuse  ,   Endo/Other  neg diabetesHypothyroidism Morbid obesity  Renal/GU negative Renal ROS     Musculoskeletal   Abdominal (+) + obese,   Peds  Hematology   Anesthesia Other Findings Past Medical History: No date: Anxiety No date: Arthritis No date: BPH (benign prostatic hyperplasia) No date: Depression 8/36/6294: Follicular cancer of thyroid (HCC) No date: GERD (gastroesophageal reflux disease) No date: Hx of psoriasis No date: Hx of thyroid cancer No date: Hypertension No date: Hypothyroidism No date: Sleep apnea No date: Spinal stenosis  Reproductive/Obstetrics                             Anesthesia Physical Anesthesia Plan  ASA: III  Anesthesia Plan: General   Post-op Pain Management:    Induction: Intravenous  PONV Risk Score and Plan: 2 and Ondansetron, Propofol infusion and TIVA  Airway Management Planned: Nasal Cannula  Additional Equipment: None  Intra-op Plan:   Post-operative Plan:   Informed Consent: I  have reviewed the patients History and Physical, chart, labs and discussed the procedure including the risks, benefits and alternatives for the proposed anesthesia with the patient or authorized representative who has indicated his/her understanding and acceptance.     Dental advisory given  Plan Discussed with: CRNA and Surgeon  Anesthesia Plan Comments: (Discussed risks of anesthesia with patient, including possibility of difficulty with spontaneous ventilation under anesthesia necessitating airway intervention, PONV, and rare risks such as cardiac or respiratory or neurological events. Patient understands. Patient informed about increased incidence of above perioperative risk due to high BMI. Patient understands. )        Anesthesia Quick Evaluation

## 2020-06-02 NOTE — Anesthesia Procedure Notes (Signed)
Performed by: Vaughan Sine Pre-anesthesia Checklist: Patient identified, Emergency Drugs available, Suction available, Patient being monitored and Timeout performed Oxygen Delivery Method: Supernova nasal CPAP Preoxygenation: Pre-oxygenation with 100% oxygen Induction Type: IV induction Placement Confirmation: positive ETCO2 and CO2 detector

## 2020-06-02 NOTE — H&P (Signed)
Outpatient short stay form Pre-procedure 06/02/2020 8:46 AM Raylene Miyamoto MD, MPH  Primary Physician:  Dr. Ginette Pitman  Reason for visit:  Surveillance Colonoscopy  History of present illness:   69 y/o gentleman with history of hypertension and hypothyroidism here for surveillance colonoscopy. No family history of GI malignancies. No abdominal surgeries. No blood thinners.    Current Facility-Administered Medications:  .  0.9 %  sodium chloride infusion, , Intravenous, Continuous, Liora Myles, Hilton Cork, MD  Medications Prior to Admission  Medication Sig Dispense Refill Last Dose  . levothyroxine (SYNTHROID, LEVOTHROID) 150 MCG tablet Take 150 mcg by mouth daily before breakfast.    06/01/2020 at Unknown time  . lisinopril-hydrochlorothiazide (PRINZIDE,ZESTORETIC) 20-12.5 MG tablet Take 1 tablet by mouth daily.    06/01/2020 at Unknown time  . omeprazole (PRILOSEC) 20 MG capsule Take 20 mg by mouth daily.    Past Week at Unknown time  . sertraline (ZOLOFT) 50 MG tablet Take 50 mg by mouth daily.    06/01/2020 at Unknown time  . tamsulosin (FLOMAX) 0.4 MG CAPS capsule Take 0.4 mg by mouth daily.    06/01/2020 at Unknown time     Allergies  Allergen Reactions  . Chlorhexidine Rash    Had rash following shoulder surgery in distribution of surgical ChloroPrep     Past Medical History:  Diagnosis Date  . Anxiety   . Arthritis   . BPH (benign prostatic hyperplasia)   . Depression   . Follicular cancer of thyroid ([place]) 06/10/2015  . GERD (gastroesophageal reflux disease)   . Hx of psoriasis   . Hx of thyroid cancer   . Hypertension   . Hypothyroidism   . Sleep apnea   . Spinal stenosis     Review of systems:  Otherwise negative.    Physical Exam  Gen: Alert, oriented. Appears stated age.  HEENT: [place]/AT. PERRLA. Lungs: No respiratory distress Abd: soft, benign, no masses. Ext: No edema.    Planned procedures: Proceed with colonoscopy. The patient understands the nature of the  planned procedure, indications, risks, alternatives and potential complications including but not limited to bleeding, infection, perforation, damage to internal organs and possible oversedation/side effects from anesthesia. The patient agrees and gives consent to proceed.  Please refer to procedure notes for findings, recommendations and patient disposition/instructions.     Raylene Miyamoto MD, MPH Gastroenterology 06/02/2020  8:46 AM

## 2020-06-05 ENCOUNTER — Ambulatory Visit: Payer: Medicare Other | Attending: Internal Medicine

## 2020-06-05 DIAGNOSIS — G4761 Periodic limb movement disorder: Secondary | ICD-10-CM | POA: Insufficient documentation

## 2020-06-05 DIAGNOSIS — G4733 Obstructive sleep apnea (adult) (pediatric): Secondary | ICD-10-CM | POA: Insufficient documentation

## 2020-06-19 MED ORDER — METHOTREXATE SODIUM 2.5 MG TABLET
ORAL | 0.00000 days
Start: 2020-06-19 — End: ?

## 2020-07-01 MED ORDER — FOLIC ACID 1 MG TABLET
ORAL | 0.00000 days
Start: 2020-07-01 — End: 2021-07-01

## 2020-07-31 ENCOUNTER — Ambulatory Visit: Admit: 2020-07-31 | Discharge: 2020-08-01 | Payer: MEDICARE | Attending: Dermatology | Primary: Dermatology

## 2020-07-31 DIAGNOSIS — D229 Melanocytic nevi, unspecified: Principal | ICD-10-CM

## 2020-07-31 DIAGNOSIS — L821 Other seborrheic keratosis: Principal | ICD-10-CM

## 2020-07-31 DIAGNOSIS — L820 Inflamed seborrheic keratosis: Principal | ICD-10-CM

## 2020-07-31 DIAGNOSIS — D1801 Hemangioma of skin and subcutaneous tissue: Principal | ICD-10-CM

## 2020-07-31 DIAGNOSIS — L739 Follicular disorder, unspecified: Principal | ICD-10-CM

## 2020-07-31 DIAGNOSIS — L570 Actinic keratosis: Principal | ICD-10-CM

## 2020-07-31 DIAGNOSIS — L578 Other skin changes due to chronic exposure to nonionizing radiation: Principal | ICD-10-CM

## 2020-07-31 DIAGNOSIS — L409 Psoriasis, unspecified: Principal | ICD-10-CM

## 2020-07-31 DIAGNOSIS — L814 Other melanin hyperpigmentation: Principal | ICD-10-CM

## 2020-07-31 MED ORDER — CLINDAMYCIN 1 % LOTION
Freq: Every morning | TOPICAL | 5 refills | 0.00000 days | Status: CP
Start: 2020-07-31 — End: ?

## 2020-10-30 ENCOUNTER — Ambulatory Visit: Admit: 2020-10-30 | Discharge: 2020-10-31 | Payer: MEDICARE | Attending: Dermatology | Primary: Dermatology

## 2020-10-30 DIAGNOSIS — D229 Melanocytic nevi, unspecified: Principal | ICD-10-CM

## 2020-10-30 DIAGNOSIS — L570 Actinic keratosis: Principal | ICD-10-CM

## 2020-10-30 DIAGNOSIS — L821 Other seborrheic keratosis: Principal | ICD-10-CM

## 2020-10-30 DIAGNOSIS — L409 Psoriasis, unspecified: Principal | ICD-10-CM

## 2021-04-02 ENCOUNTER — Ambulatory Visit: Payer: Self-pay | Admitting: Urology

## 2021-04-19 ENCOUNTER — Emergency Department: Payer: Medicare Other

## 2021-04-19 ENCOUNTER — Emergency Department
Admission: EM | Admit: 2021-04-19 | Discharge: 2021-04-20 | Disposition: A | Discharge status: Home / Self Care | Payer: Medicare Other | Attending: Emergency Medicine | Admitting: Emergency Medicine

## 2021-04-19 ENCOUNTER — Other Ambulatory Visit: Payer: Self-pay

## 2021-04-19 ENCOUNTER — Encounter: Payer: Self-pay | Admitting: Emergency Medicine

## 2021-04-19 DIAGNOSIS — Z20822 Contact with and (suspected) exposure to covid-19: Secondary | ICD-10-CM | POA: Diagnosis not present

## 2021-04-19 DIAGNOSIS — R110 Nausea: Secondary | ICD-10-CM | POA: Diagnosis not present

## 2021-04-19 DIAGNOSIS — R300 Dysuria: Secondary | ICD-10-CM | POA: Diagnosis not present

## 2021-04-19 DIAGNOSIS — Z85850 Personal history of malignant neoplasm of thyroid: Secondary | ICD-10-CM | POA: Diagnosis not present

## 2021-04-19 DIAGNOSIS — M546 Pain in thoracic spine: Secondary | ICD-10-CM | POA: Insufficient documentation

## 2021-04-19 DIAGNOSIS — I700 Atherosclerosis of aorta: Secondary | ICD-10-CM | POA: Diagnosis not present

## 2021-04-19 DIAGNOSIS — Z79899 Other long term (current) drug therapy: Secondary | ICD-10-CM | POA: Diagnosis not present

## 2021-04-19 DIAGNOSIS — R509 Fever, unspecified: Secondary | ICD-10-CM | POA: Diagnosis not present

## 2021-04-19 DIAGNOSIS — E039 Hypothyroidism, unspecified: Secondary | ICD-10-CM | POA: Insufficient documentation

## 2021-04-19 LAB — RESP PANEL BY RT-PCR (FLU A&B, COVID) ARPGX2
Influenza A by PCR: NEGATIVE
Influenza B by PCR: NEGATIVE
SARS Coronavirus 2 by RT PCR: NEGATIVE

## 2021-04-19 LAB — CBC WITH DIFFERENTIAL/PLATELET
Abs Immature Granulocytes: 0.12 10*3/uL — ABNORMAL HIGH (ref 0.00–0.07)
Basophils Absolute: 0 10*3/uL (ref 0.0–0.1)
Basophils Relative: 0 %
Eosinophils Absolute: 0 10*3/uL (ref 0.0–0.5)
Eosinophils Relative: 0 %
HCT: 47.8 % (ref 39.0–52.0)
Hemoglobin: 16.6 g/dL (ref 13.0–17.0)
Immature Granulocytes: 1 %
Lymphocytes Relative: 9 %
Lymphs Abs: 0.9 10*3/uL (ref 0.7–4.0)
MCH: 31.6 pg (ref 26.0–34.0)
MCHC: 34.7 g/dL (ref 30.0–36.0)
MCV: 90.9 fL (ref 80.0–100.0)
Monocytes Absolute: 1 10*3/uL (ref 0.1–1.0)
Monocytes Relative: 10 %
Neutro Abs: 8 10*3/uL — ABNORMAL HIGH (ref 1.7–7.7)
Neutrophils Relative %: 80 %
Platelets: 168 10*3/uL (ref 150–400)
RBC: 5.26 MIL/uL (ref 4.22–5.81)
RDW: 14.5 % (ref 11.5–15.5)
WBC: 10.1 10*3/uL (ref 4.0–10.5)
nRBC: 0 % (ref 0.0–0.2)

## 2021-04-19 LAB — URINALYSIS, COMPLETE (UACMP) WITH MICROSCOPIC
Bilirubin Urine: NEGATIVE
Glucose, UA: NEGATIVE mg/dL
Hgb urine dipstick: NEGATIVE
Ketones, ur: NEGATIVE mg/dL
Leukocytes,Ua: NEGATIVE
Nitrite: NEGATIVE
Protein, ur: 30 mg/dL — AB
Specific Gravity, Urine: 1.03 (ref 1.005–1.030)
pH: 5 (ref 5.0–8.0)

## 2021-04-19 LAB — COMPREHENSIVE METABOLIC PANEL
ALT: 38 U/L (ref 0–44)
AST: 33 U/L (ref 15–41)
Albumin: 3.7 g/dL (ref 3.5–5.0)
Alkaline Phosphatase: 43 U/L (ref 38–126)
Anion gap: 5 (ref 5–15)
BUN: 18 mg/dL (ref 8–23)
CO2: 26 mmol/L (ref 22–32)
Calcium: 8.7 mg/dL — ABNORMAL LOW (ref 8.9–10.3)
Chloride: 105 mmol/L (ref 98–111)
Creatinine, Ser: 1.07 mg/dL (ref 0.61–1.24)
GFR, Estimated: >60 mL/min (ref >60)
Glucose, Bld: 121 mg/dL — ABNORMAL HIGH (ref 70–99)
Potassium: 3.3 mmol/L — ABNORMAL LOW (ref 3.5–5.1)
Sodium: 136 mmol/L (ref 135–145)
Total Bilirubin: 1.1 mg/dL (ref 0.3–1.2)
Total Protein: 7.6 g/dL (ref 6.5–8.1)

## 2021-04-19 LAB — PROTIME-INR
INR: 1.1 (ref 0.8–1.2)
Prothrombin Time: 14.1 seconds (ref 11.4–15.2)

## 2021-04-19 LAB — LACTIC ACID, PLASMA
Lactic Acid, Venous: 1.5 mmol/L (ref 0.5–1.9)
Lactic Acid, Venous: 1.6 mmol/L (ref 0.5–1.9)

## 2021-04-19 MED ORDER — IOHEXOL 350 MG/ML SOLN
100.0000 mL | Freq: Once | INTRAVENOUS | Status: AC | PRN
  Administered 2021-04-19: 100 mL via INTRAVENOUS

## 2021-04-19 NOTE — ED Triage Notes (Signed)
Pt to ED via POV c/o fever and UTI. Pt states that he was diagnosed with UTI on Friday and started taking Cipro. Pt states that on Saturday he started running fever. Pt states that he was taking ibuprofen and tylenol for fever. Pt reports having chills also. Pt is in NAD.

## 2021-04-19 NOTE — ED Provider Notes (Signed)
-----------------------------------------   11:45 PM on 04/19/2021 -----------------------------------------  Assuming care from Dr. Cheri Fowler.  In short, Terry Odonnell. is a 70 y.o. male with a chief complaint of dysuria and some bilateral low back pain.  Refer to the original H&P for additional details.  The current plan of care is to follow-up on labs, urinalysis, and CT scan and reassess.  Patient has recently been on Cipro.    ----------------------------------------- 1:03 AM on 04/20/2021 -----------------------------------------  Patient's respiratory viral panel is negative, lactic acid is normal, coags normal, CBC normal, CMP is normal other than a very slightly decreased potassium.  Urinalysis shows no active infection although he has been on antibiotics.  Blood cultures are pending.  CT scan also shows no sign of acute abnormality including no evidence of pyelonephritis nor obstructive uropathy.  Patient feels nauseated with some dysuria but otherwise is stable.  No explanation currently for his fever.  I went over his results in detail with him and his wife.  We talked about Cipro for his urinary tract infection; he has had 4 doses so far and is concerned it is not working.  We discussed alternative options and decided to switch to Keflex for broader coverage as per the local antibiogram and infectious disease recommendations.  The patient and his wife will follow-up with [facility] and I gave my usual and customary return precautions.  Final diagnoses:  Fever, unspecified fever cause  Nausea  Dysuria   ED Discharge Orders          Ordered    ondansetron (ZOFRAN ODT) 4 MG disintegrating tablet        04/20/21 0102    cephALEXin (KEFLEX) 500 MG capsule  4 times daily        04/20/21 0102                Hinda Kehr, MD 04/20/21 0105

## 2021-04-19 NOTE — ED Provider Notes (Signed)
[facility] Emergency Department Provider Note   ____________________________________________   Event Date/Time   First MD Initiated Contact with Patient 04/19/21 2142     (approximate)  I have reviewed the triage vital signs and the nursing notes.   HISTORY  Chief Complaint Fever and Urinary Tract Infection    HPI Terry Odonnell. is a 70 y.o. male who presents for dysuria  LOCATION: Penis DURATION: 4 days prior to arrival TIMING: Initially improved and now worsened since onset SEVERITY: Moderate QUALITY: Dysuria CONTEXT: Patient states that he began having fevers, chills, and difficulty urinating approximately 4 days prior to arrival.  Patient was given a prescription for ciprofloxacin and told to double up on his tamsulosin which initially improved his symptoms however when he woke up this morning all symptoms recurred MODIFYING FACTORS: Denies any relieving factors or exacerbating factors ASSOCIATED SYMPTOMS: Fever, chills, upper back pain   Per medical record review, patient has history of of BPH and kidney stones          Past Medical History:  Diagnosis Date   Anxiety    Arthritis    BPH (benign prostatic hyperplasia)    Depression    Follicular cancer of thyroid ([place]) 06/10/2015   GERD (gastroesophageal reflux disease)    Hx of psoriasis    Hx of thyroid cancer    Hypertension    Hypothyroidism    Sleep apnea    Spinal stenosis     Patient Active Problem List   Diagnosis Date Noted   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 Authorizing Provider  levothyroxine (SYNTHROID, LEVOTHROID) 150 MCG tablet Take 150 mcg by mouth daily before breakfast.  12/13/14   [provider]  lisinopril-hydrochlorothiazide (PRINZIDE,ZESTORETIC) 20-12.5 MG tablet Take 1 tablet by mouth daily.  12/13/14   [provider]  omeprazole (PRILOSEC) 20 MG capsule Take 20 mg by mouth daily.  12/13/14   [provider]  sertraline (ZOLOFT) 50 MG tablet Take 50 mg by mouth daily.  12/13/14   [provider]  tamsulosin (FLOMAX) 0.4 MG CAPS capsule Take 0.4 mg by mouth daily.  12/13/14   [provider]    Allergies Chlorhexidine  Family History  Problem Relation Age of Onset   Lymphoma Father     Social History Social History   Tobacco Use   Smoking status: Never   Smokeless tobacco: Never  Vaping Use   Vaping Use: Never used  Substance Use Topics   Alcohol use: Yes    Alcohol/week: 1.0 - 2.0 standard drink    Types: 1 - 2 Standard drinks or equivalent per week   Drug use: Never    Review of Systems Constitutional: Endorses fever/chills Eyes: No visual changes. ENT: No sore throat. Cardiovascular: Denies chest pain. Respiratory: Denies shortness of breath. Gastrointestinal: No abdominal pain.  No nausea, no vomiting.  No diarrhea. Genitourinary: Positive for dysuria. Musculoskeletal: Positive for acute back pain Skin: Negative for rash. Neurological: Negative for headaches, weakness/numbness/paresthesias in any extremity Psychiatric: Negative  for suicidal ideation/homicidal ideation   ____________________________________________   PHYSICAL EXAM:  VITAL SIGNS: ED Triage Vitals  Enc Vitals Group     BP 04/19/21 1642 121/62     Pulse Rate 04/19/21 1639 97     Resp 04/19/21 1639 16     Temp 04/19/21 1639 (!) 100.9 F (38.3 C)     Temp Source 04/19/21 1639 Oral     SpO2 04/19/21 1639 97 %     Weight 04/19/21 1640 297 lb (134.7 kg)     Height 04/19/21 1640  '5\' 10"'$  (1.778 m)     Head Circumference --      Peak Flow --      Pain Score 04/19/21 1640 0     Pain Loc --      Pain Edu? --      Excl. in [place]? --    Constitutional: Alert and oriented. Well appearing and in no acute distress. Eyes: Conjunctivae are normal. PERRL. Head: Atraumatic. Nose: No congestion/rhinnorhea. Mouth/Throat: Mucous membranes are moist. Neck: No stridor Cardiovascular: Grossly normal heart sounds.  Good peripheral circulation. Respiratory: Normal respiratory effort.  No retractions. Gastrointestinal: Soft and nontender. No distention. Musculoskeletal: No obvious deformities Neurologic:  Normal speech and language. No gross focal neurologic deficits are appreciated. Skin:  Skin is warm and dry. No rash noted. Psychiatric: Mood and affect are normal. Speech and behavior are normal.  ____________________________________________   LABS (all labs ordered are listed, but only abnormal results are displayed)  Labs Reviewed  COMPREHENSIVE METABOLIC PANEL - Abnormal; Notable for the following components:      Result Value   Potassium 3.3 (*)    Glucose, Bld 121 (*)    Calcium 8.7 (*)    All other components within normal limits  CBC WITH DIFFERENTIAL/PLATELET - Abnormal; Notable for the following components:   Neutro Abs 8.0 (*)    Abs Immature Granulocytes 0.12 (*)    All other components within normal limits  URINALYSIS, COMPLETE (UACMP) WITH MICROSCOPIC - Abnormal; Notable for the following components:   Color, Urine YELLOW (*)    APPearance HAZY (*)    Protein, ur 30 (*)    All other components within normal limits  CULTURE, BLOOD (ROUTINE X 2)  CULTURE, BLOOD (ROUTINE X 2)  RESP PANEL BY RT-PCR (FLU A&B, COVID) ARPGX2  LACTIC ACID, PLASMA  LACTIC ACID, PLASMA  PROTIME-INR     RADIOLOGY  ED MD interpretation: 2 view chest x-ray shows no evidence of acute abnormalities including no pneumonia, pneumothorax, or widened mediastinum  Official radiology  report(s): DG Chest 2 View  Result Date: 04/19/2021 CLINICAL DATA:  Suspected sepsis EXAM: CHEST - 2 VIEW COMPARISON:  None. FINDINGS: Mild elevation of the right hemidiaphragm. Heart and mediastinal contours are within normal limits. No focal opacities or effusions. No acute bony abnormality. IMPRESSION: No active cardiopulmonary disease. Electronically Signed   By: Rolm Baptise M.D.   On: 04/19/2021 17:13    ____________________________________________   PROCEDURES  Procedure(s) performed (including Critical Care):  .1-3 Lead EKG Interpretation  Date/Time: 04/19/2021 11:40 PM Performed by: Naaman Plummer, MD Authorized by: Naaman Plummer, MD     Interpretation: normal     ECG rate:  80   ECG rate assessment: normal     Rhythm: sinus rhythm     Ectopy: none     Conduction: normal     ____________________________________________   INITIAL IMPRESSION / ASSESSMENT AND PLAN / ED COURSE  As part of my  medical decision making, I reviewed the following data within the electronic medical record, if available:  Nursing notes reviewed and incorporated, Labs reviewed, EKG interpreted, Old chart reviewed, Radiograph reviewed and Notes from prior ED visits reviewed and incorporated        Patient is a 70 year old male that presents for fever, chills, dysuria, and upper back pain.  Given history and physical exam, concern for: Urosepsis, kidney stones, COVID infection  Care of this patient will be signed out to the oncoming physician at the end of my shift.  All pertinent patient information conveyed and all questions answered.  All further care and disposition decisions will be made by the oncoming physician.      ____________________________________________   FINAL CLINICAL IMPRESSION(S) / ED DIAGNOSES  Final diagnoses:  None     ED Discharge Orders     None        Note:  This document was prepared using Dragon voice recognition software and may include unintentional  dictation errors.    Naaman Plummer, MD 04/19/21 [phone]

## 2021-04-20 DIAGNOSIS — R509 Fever, unspecified: Secondary | ICD-10-CM | POA: Diagnosis not present

## 2021-04-20 MED ORDER — ONDANSETRON 4 MG PO TBDP: ORAL_TABLET | ORAL | 0 refills | Status: DC

## 2021-04-20 MED ORDER — CEPHALEXIN 500 MG PO CAPS: 500.0000 mg | ORAL_CAPSULE | Freq: Four times a day (QID) | ORAL | 0 refills | Status: AC

## 2021-04-20 MED ORDER — ONDANSETRON HCL 4 MG/2ML IJ SOLN
4.0000 mg | INTRAMUSCULAR | Status: AC
  Administered 2021-04-20: 4 mg via INTRAVENOUS
  Filled 2021-04-20: qty 2

## 2021-04-20 NOTE — Discharge Instructions (Addendum)
As we discussed, your evaluation was reassuring tonight.  A specific cause of your fever was not identified, but your lab work including a COVID test, CT scan, and urinalysis were all normal without a specific abnormality to explain your symptoms.  As per our discussion, we prescribed you some nausea medicine and switched your antibiotic from Cipro to Keflex.  We encourage you to take the full course of antibiotic as prescribed and follow-up with [facility].  You can call them tomorrow and schedule follow-up appointment which will likely be next week.  Continue to drink plenty of fluids and take ibuprofen and/or Tylenol as needed according to label instructions to help with discomfort and fever.    Return to the emergency department if you develop new or worsening symptoms that concern you.

## 2021-04-21 LAB — URINE CULTURE: Culture: NO GROWTH

## 2021-04-24 LAB — CULTURE, BLOOD (ROUTINE X 2)
Culture: NO GROWTH
Culture: NO GROWTH

## 2022-01-16 ENCOUNTER — Encounter: Payer: Self-pay | Admitting: Emergency Medicine

## 2022-01-16 ENCOUNTER — Emergency Department: Payer: Medicare Other

## 2022-01-16 ENCOUNTER — Emergency Department
Admission: EM | Admit: 2022-01-16 | Discharge: 2022-01-16 | Disposition: A | Discharge status: Home / Self Care | Payer: Medicare Other | Attending: Emergency Medicine | Admitting: Emergency Medicine

## 2022-01-16 ENCOUNTER — Other Ambulatory Visit: Payer: Self-pay

## 2022-01-16 DIAGNOSIS — F458 Other somatoform disorders: Secondary | ICD-10-CM | POA: Insufficient documentation

## 2022-01-16 DIAGNOSIS — I10 Essential (primary) hypertension: Secondary | ICD-10-CM | POA: Insufficient documentation

## 2022-01-16 DIAGNOSIS — R0602 Shortness of breath: Secondary | ICD-10-CM | POA: Diagnosis not present

## 2022-01-16 DIAGNOSIS — R0989 Other specified symptoms and signs involving the circulatory and respiratory systems: Secondary | ICD-10-CM

## 2022-01-16 LAB — BASIC METABOLIC PANEL
Anion gap: 9 (ref 5–15)
BUN: 21 mg/dL (ref 8–23)
CO2: 23 mmol/L (ref 22–32)
Calcium: 9.1 mg/dL (ref 8.9–10.3)
Chloride: 107 mmol/L (ref 98–111)
Creatinine, Ser: 0.88 mg/dL (ref 0.61–1.24)
GFR, Estimated: >60 mL/min (ref >60)
Glucose, Bld: 107 mg/dL — ABNORMAL HIGH (ref 70–99)
Potassium: 3.7 mmol/L (ref 3.5–5.1)
Sodium: 139 mmol/L (ref 135–145)

## 2022-01-16 LAB — CBC
HCT: 45.1 % (ref 39.0–52.0)
Hemoglobin: 14.9 g/dL (ref 13.0–17.0)
MCH: 30.7 pg (ref 26.0–34.0)
MCHC: 33 g/dL (ref 30.0–36.0)
MCV: 92.8 fL (ref 80.0–100.0)
Platelets: 159 10*3/uL (ref 150–400)
RBC: 4.86 MIL/uL (ref 4.22–5.81)
RDW: 14.1 % (ref 11.5–15.5)
WBC: 7.3 10*3/uL (ref 4.0–10.5)
nRBC: 0 % (ref 0.0–0.2)

## 2022-01-16 LAB — BRAIN NATRIURETIC PEPTIDE: B Natriuretic Peptide: 19.5 pg/mL (ref 0.0–100.0)

## 2022-01-16 LAB — TROPONIN I (HIGH SENSITIVITY)
Troponin I (High Sensitivity): 7 ng/L (ref <18)
Troponin I (High Sensitivity): 6 ng/L (ref <18)

## 2022-01-16 MED ORDER — LIDOCAINE VISCOUS HCL 2 % MT SOLN
15.0000 mL | Freq: Once | OROMUCOSAL | Status: AC
  Administered 2022-01-16: 15 mL via ORAL
  Filled 2022-01-16: qty 15

## 2022-01-16 MED ORDER — ALUM & MAG HYDROXIDE-SIMETH 200-200-20 MG/5ML PO SUSP
30.0000 mL | Freq: Once | ORAL | Status: AC
  Administered 2022-01-16: 30 mL via ORAL
  Filled 2022-01-16: qty 30

## 2022-01-16 MED ORDER — IPRATROPIUM-ALBUTEROL 0.5-2.5 (3) MG/3ML IN SOLN
3.0000 mL | Freq: Once | RESPIRATORY_TRACT | Status: AC
  Administered 2022-01-16: 3 mL via RESPIRATORY_TRACT
  Filled 2022-01-16: qty 3

## 2022-01-16 NOTE — ED Notes (Signed)
Blue top sent with reg labs. ? ?Pt to ED for chest tightness since this AM, states it feels tight, sleeps with CPAP at night, woke up and felt like he "couldn't breathe" and then chest felt tight. ? ?EDP at bedside. ?

## 2022-01-16 NOTE — ED Triage Notes (Signed)
Pt via POV from home. Pt c/o mid-sternal CP, SOB, and feeling like something stuck in his throat, states that the pain started this morning and it feels like a tightness. Denies cardiac hx. Pt is A&Ox4 and NAD ?

## 2022-01-16 NOTE — ED Provider Notes (Signed)
? ?[facility] ?Provider Note ? ? ? Event Date/Time  ? First MD Initiated Contact with Patient 01/16/22 [phone]   ?  (approximate) ? ? ?History  ? ?Shortness of breath ? ?HPI ? ?Dietrick Rhett Mutschler. is a 71 y.o. male with a history of psoriatic arthritis on immunosuppressants, anxiety, hypertension, sleep apnea on CPAP who presents with complaints of shortness of breath.  Patient reports last night while lying down he felt short of breath and an odd sensation in his throat.  Symptoms relieved when he got up and stood in front of a fan.  They returned when he laid back down again.  No fevers or chills.  No rash or allergic reaction.  No history of CHF. ?  ? ? ?Physical Exam  ? ?Triage Vital Signs: ?ED Triage Vitals  ?Enc Vitals Group  ?   BP 01/16/22 0750 140/76  ?   Pulse Rate 01/16/22 0750 69  ?   Resp 01/16/22 0750 18  ?   Temp 01/16/22 0750 98 ?F (36.7 ?C)  ?   Temp Source 01/16/22 0750 Oral  ?   SpO2 01/16/22 0750 97 %  ?   Weight 01/16/22 0746 127 kg (280 lb)  ?   Height 01/16/22 0746 1.803 m ('5\' 11"'$ )  ?   Head Circumference --   ?   Peak Flow --   ?   Pain Score 01/16/22 0746 5  ?   Pain Loc --   ?   Pain Edu? --   ?   Excl. in [place]? --   ? ? ?Most recent vital signs: ?Vitals:  ? 01/16/22 0750 01/16/22 1030  ?BP: 140/76 (!) 142/80  ?Pulse: 69 77  ?Resp: 18 16  ?Temp: 98 ?F (36.7 ?C)   ?SpO2: 97% 96%  ? ? ? ?General: Awake, no distress.  ?CV:  Good peripheral perfusion.  Regular rate and rhythm ?Resp:  Normal effort.  CTA bilaterally ?Abd:  No distention.  ?Other:  Minimal edema bilaterally ? ? ?ED Results / Procedures / Treatments  ? ?Labs ?(all labs ordered are listed, but only abnormal results are displayed) ?Labs Reviewed  ?BASIC METABOLIC PANEL - Abnormal; Notable for the following components:  ?    Result Value  ? Glucose, Bld 107 (*)   ? All other components within normal limits  ?CBC  ?BRAIN NATRIURETIC PEPTIDE  ?TROPONIN I (HIGH SENSITIVITY)  ?TROPONIN I (HIGH SENSITIVITY)   ? ? ? ?EKG ? ?ED ECG REPORT ?I, Lavonia Drafts, the attending physician, personally viewed and interpreted this ECG. ? ?Date: 01/16/2022 ? ?Rhythm: normal sinus rhythm ?QRS Axis: normal ?Intervals: normal ?ST/T Wave abnormalities: Nonspecific changes ?Narrative Interpretation: no evidence of acute ischemia ? ? ? ?RADIOLOGY ?Chest x-ray interpreted by me, no edema ? ? ? ?PROCEDURES: ? ?Critical Care performed:  ? ?Procedures ? ? ?MEDICATIONS ORDERED IN ED: ?Medications  ?alum & mag hydroxide-simeth (MAALOX/MYLANTA) 200-200-20 MG/5ML suspension 30 mL (30 mLs Oral Given 01/16/22 0948)  ?  And  ?lidocaine (XYLOCAINE) 2 % viscous mouth solution 15 mL (15 mLs Oral Given 01/16/22 0948)  ?ipratropium-albuterol (DUONEB) 0.5-2.5 (3) MG/3ML nebulizer solution 3 mL (3 mLs Nebulization Given 01/16/22 0948)  ? ? ? ?IMPRESSION / MDM / ASSESSMENT AND PLAN / ED COURSE  ?I reviewed the triage vital signs and the nursing notes. ? ? ?Patient presents with an acute potentially life-threatening condition given his shortness of breath with lying down. ? ?He is on immunosuppressants which raises the possibility  of pneumonia/infection.  He does not smoke, no wheezing to suggest COPD.  Possibility of globus sensation relating from shortness of breath but no evidence of allergic reaction. ? ?However I am suspicious of CHF/pulmonary edema given symptoms with lying down, improving with standing up.  He does have a history of hypertension ? ?EKG with nonspecific changes, pending troponin, BNP, chest x-ray ? ?Further history provided by wife, she reports that family member recently died, they have been under significant stress, they also ate very late last night and she is concerned about acid reflux. ? ?This does increase the possibly of globus sensation given reassuring x-ray, normal high sensitive troponin, reassuring labs.  BNP is normal ? ?Delta troponin unchanged ? ?Considered admission for observation however patient would prefer discharge with  outpatient follow-up ? ?We will give GI cocktail, DuoNeb and discharge with outpatient follow-up with ENT ?  ? ? ?FINAL CLINICAL IMPRESSION(S) / ED DIAGNOSES  ? ?Final diagnoses:  ?Globus sensation  ?Shortness of breath  ? ? ? ?Rx / DC Orders  ? ?ED Discharge Orders   ? ? None  ? ?  ? ? ? ?Note:  This document was prepared using Dragon voice recognition software and may include unintentional dictation errors. ?  ?Lavonia Drafts, MD ?01/16/22 1103 ? ?

## 2022-01-16 NOTE — Discharge Instructions (Signed)
Your lab work today was quite reassuring, your chest x-ray was normal.  Your heart tests were normal.  Please continue to take your omeprazole, I have referred you to ENT for further evaluation.  If symptoms worsen please return to the emergency department. ?

## 2022-03-02 ENCOUNTER — Other Ambulatory Visit: Payer: Self-pay | Admitting: Unknown Physician Specialty

## 2022-03-02 DIAGNOSIS — R221 Localized swelling, mass and lump, neck: Secondary | ICD-10-CM

## 2022-03-09 ENCOUNTER — Ambulatory Visit
Admission: RE | Admit: 2022-03-09 | Discharge: 2022-03-09 | Disposition: A | Discharge status: Home / Self Care | Payer: Medicare Other | Source: Ambulatory Visit | Attending: Unknown Physician Specialty | Admitting: Unknown Physician Specialty

## 2022-03-09 DIAGNOSIS — R221 Localized swelling, mass and lump, neck: Secondary | ICD-10-CM

## 2022-03-09 MED ORDER — IOPAMIDOL (ISOVUE-300) INJECTION 61%
75.0000 mL | Freq: Once | INTRAVENOUS | Status: AC | PRN
  Administered 2022-03-09: 75 mL via INTRAVENOUS

## 2024-04-26 ENCOUNTER — Other Ambulatory Visit: Payer: Self-pay | Admitting: Internal Medicine

## 2024-04-26 ENCOUNTER — Encounter: Payer: Self-pay | Admitting: Hematology and Oncology

## 2024-04-26 DIAGNOSIS — C73 Malignant neoplasm of thyroid gland: Secondary | ICD-10-CM

## 2024-04-26 DIAGNOSIS — R221 Localized swelling, mass and lump, neck: Secondary | ICD-10-CM

## 2024-04-30 ENCOUNTER — Encounter: Payer: Self-pay | Admitting: Hematology and Oncology

## 2024-05-02 ENCOUNTER — Encounter: Payer: Self-pay | Admitting: Hematology and Oncology

## 2024-05-03 ENCOUNTER — Ambulatory Visit
Admission: RE | Admit: 2024-05-03 | Discharge: 2024-05-03 | Disposition: A | Discharge status: Home / Self Care | Source: Ambulatory Visit | Attending: Internal Medicine | Admitting: Internal Medicine

## 2024-05-03 ENCOUNTER — Encounter: Payer: Self-pay | Admitting: Hematology and Oncology

## 2024-05-03 DIAGNOSIS — R221 Localized swelling, mass and lump, neck: Secondary | ICD-10-CM | POA: Diagnosis present

## 2024-05-03 DIAGNOSIS — C73 Malignant neoplasm of thyroid gland: Secondary | ICD-10-CM | POA: Diagnosis present

## 2024-05-03 MED ORDER — SODIUM CHLORIDE 0.9 % IV SOLN: INTRAVENOUS | Status: DC

## 2024-05-03 MED ORDER — IOHEXOL 300 MG/ML  SOLN
75.0000 mL | Freq: Once | INTRAMUSCULAR | Status: AC | PRN
  Administered 2024-05-03: 75 mL via INTRAVENOUS

## 2024-06-28 ENCOUNTER — Observation Stay
Admission: EM | Admit: 2024-06-28 | Discharge: 2024-06-30 | Disposition: A | Discharge status: Home / Self Care | Attending: Family Medicine | Admitting: Family Medicine

## 2024-06-28 ENCOUNTER — Encounter: Payer: Self-pay | Admitting: Internal Medicine

## 2024-06-28 ENCOUNTER — Emergency Department

## 2024-06-28 ENCOUNTER — Other Ambulatory Visit: Payer: Self-pay

## 2024-06-28 ENCOUNTER — Encounter: Payer: Self-pay | Admitting: Hematology and Oncology

## 2024-06-28 DIAGNOSIS — Z6839 Body mass index (BMI) 39.0-39.9, adult: Secondary | ICD-10-CM | POA: Diagnosis not present

## 2024-06-28 DIAGNOSIS — K5730 Diverticulosis of large intestine without perforation or abscess without bleeding: Secondary | ICD-10-CM | POA: Diagnosis not present

## 2024-06-28 DIAGNOSIS — K219 Gastro-esophageal reflux disease without esophagitis: Secondary | ICD-10-CM | POA: Diagnosis not present

## 2024-06-28 DIAGNOSIS — K760 Fatty (change of) liver, not elsewhere classified: Secondary | ICD-10-CM | POA: Diagnosis not present

## 2024-06-28 DIAGNOSIS — L400 Psoriasis vulgaris: Secondary | ICD-10-CM | POA: Diagnosis present

## 2024-06-28 DIAGNOSIS — R079 Chest pain, unspecified: Secondary | ICD-10-CM | POA: Insufficient documentation

## 2024-06-28 DIAGNOSIS — C73 Malignant neoplasm of thyroid gland: Secondary | ICD-10-CM | POA: Diagnosis present

## 2024-06-28 DIAGNOSIS — B9629 Other Escherichia coli [E. coli] as the cause of diseases classified elsewhere: Secondary | ICD-10-CM | POA: Diagnosis not present

## 2024-06-28 DIAGNOSIS — R531 Weakness: Secondary | ICD-10-CM | POA: Diagnosis present

## 2024-06-28 DIAGNOSIS — I10 Essential (primary) hypertension: Secondary | ICD-10-CM | POA: Diagnosis not present

## 2024-06-28 DIAGNOSIS — L4059 Other psoriatic arthropathy: Secondary | ICD-10-CM | POA: Insufficient documentation

## 2024-06-28 DIAGNOSIS — N3000 Acute cystitis without hematuria: Secondary | ICD-10-CM | POA: Diagnosis not present

## 2024-06-28 DIAGNOSIS — D849 Immunodeficiency, unspecified: Secondary | ICD-10-CM | POA: Insufficient documentation

## 2024-06-28 DIAGNOSIS — E872 Acidosis, unspecified: Secondary | ICD-10-CM

## 2024-06-28 DIAGNOSIS — K5790 Diverticulosis of intestine, part unspecified, without perforation or abscess without bleeding: Secondary | ICD-10-CM | POA: Diagnosis not present

## 2024-06-28 DIAGNOSIS — F32A Depression, unspecified: Secondary | ICD-10-CM | POA: Diagnosis not present

## 2024-06-28 DIAGNOSIS — D72829 Elevated white blood cell count, unspecified: Secondary | ICD-10-CM | POA: Insufficient documentation

## 2024-06-28 DIAGNOSIS — N390 Urinary tract infection, site not specified: Secondary | ICD-10-CM | POA: Diagnosis not present

## 2024-06-28 DIAGNOSIS — E039 Hypothyroidism, unspecified: Secondary | ICD-10-CM | POA: Insufficient documentation

## 2024-06-28 LAB — CBC
HCT: 46.7 % (ref 39.0–52.0)
Hemoglobin: 15.6 g/dL (ref 13.0–17.0)
MCH: 30.6 pg (ref 26.0–34.0)
MCHC: 33.4 g/dL (ref 30.0–36.0)
MCV: 91.6 fL (ref 80.0–100.0)
Platelets: 129 K/uL — ABNORMAL LOW (ref 150–400)
RBC: 5.1 MIL/uL (ref 4.22–5.81)
RDW: 15.1 % (ref 11.5–15.5)
WBC: 19.9 K/uL — ABNORMAL HIGH (ref 4.0–10.5)
nRBC: 0 % (ref 0.0–0.2)

## 2024-06-28 LAB — URINALYSIS, ROUTINE W REFLEX MICROSCOPIC
Bilirubin Urine: NEGATIVE
Glucose, UA: NEGATIVE mg/dL
Ketones, ur: NEGATIVE mg/dL
Nitrite: POSITIVE — AB
Protein, ur: 100 mg/dL — AB
Specific Gravity, Urine: 1.021 (ref 1.005–1.030)
pH: 5 (ref 5.0–8.0)

## 2024-06-28 LAB — PROTIME-INR
INR: 1.1 (ref 0.8–1.2)
Prothrombin Time: 15.2 s (ref 11.4–15.2)

## 2024-06-28 LAB — COMPREHENSIVE METABOLIC PANEL WITH GFR
ALT: 46 U/L — ABNORMAL HIGH (ref 0–44)
AST: 33 U/L (ref 15–41)
Albumin: 4 g/dL (ref 3.5–5.0)
Alkaline Phosphatase: 40 U/L (ref 38–126)
Anion gap: 13 (ref 5–15)
BUN: 16 mg/dL (ref 8–23)
CO2: 22 mmol/L (ref 22–32)
Calcium: 9.4 mg/dL (ref 8.9–10.3)
Chloride: 101 mmol/L (ref 98–111)
Creatinine, Ser: 1.04 mg/dL (ref 0.61–1.24)
GFR, Estimated: >60 mL/min (ref >60)
Glucose, Bld: 116 mg/dL — ABNORMAL HIGH (ref 70–99)
Potassium: 3.8 mmol/L (ref 3.5–5.1)
Sodium: 136 mmol/L (ref 135–145)
Total Bilirubin: 1.6 mg/dL — ABNORMAL HIGH (ref 0.0–1.2)
Total Protein: 7.5 g/dL (ref 6.5–8.1)

## 2024-06-28 LAB — RESP PANEL BY RT-PCR (RSV, FLU A&B, COVID)  RVPGX2
Influenza A by PCR: NEGATIVE
Influenza B by PCR: NEGATIVE
Resp Syncytial Virus by PCR: NEGATIVE
SARS Coronavirus 2 by RT PCR: NEGATIVE

## 2024-06-28 LAB — LIPASE, BLOOD: Lipase: 31 U/L (ref 11–51)

## 2024-06-28 LAB — LACTIC ACID, PLASMA
Lactic Acid, Venous: 2.9 mmol/L (ref 0.5–1.9)
Lactic Acid, Venous: 1.6 mmol/L (ref 0.5–1.9)

## 2024-06-28 MED ORDER — ONDANSETRON HCL 4 MG/2ML IJ SOLN
4.0000 mg | Freq: Once | INTRAMUSCULAR | Status: AC
  Administered 2024-06-28: 4 mg via INTRAVENOUS
  Filled 2024-06-28: qty 2

## 2024-06-28 MED ORDER — ONDANSETRON HCL 4 MG PO TABS: 4.0000 mg | ORAL_TABLET | Freq: Four times a day (QID) | ORAL | Status: DC | PRN

## 2024-06-28 MED ORDER — LEVOTHYROXINE SODIUM 100 MCG PO TABS
150.0000 ug | ORAL_TABLET | Freq: Every day | ORAL | Status: DC
  Administered 2024-06-29 – 2024-06-30 (×2): 150 ug via ORAL
  Filled 2024-06-28 (×2): qty 1

## 2024-06-28 MED ORDER — LACTATED RINGERS IV BOLUS
1000.0000 mL | Freq: Once | INTRAVENOUS | Status: AC
  Administered 2024-06-28: 1000 mL via INTRAVENOUS

## 2024-06-28 MED ORDER — HYDRALAZINE HCL 20 MG/ML IJ SOLN: 5.0000 mg | Freq: Four times a day (QID) | INTRAMUSCULAR | Status: DC | PRN

## 2024-06-28 MED ORDER — SODIUM CHLORIDE 0.9 % IV SOLN: INTRAVENOUS | Status: AC

## 2024-06-28 MED ORDER — CEFTRIAXONE SODIUM 2 G IJ SOLR
2.0000 g | Freq: Once | INTRAMUSCULAR | Status: AC
  Administered 2024-06-28: 2 g via INTRAVENOUS
  Filled 2024-06-28: qty 20

## 2024-06-28 MED ORDER — IOHEXOL 300 MG/ML  SOLN
100.0000 mL | Freq: Once | INTRAMUSCULAR | Status: AC | PRN
  Administered 2024-06-28: 100 mL via INTRAVENOUS

## 2024-06-28 MED ORDER — LISINOPRIL 20 MG PO TABS
20.0000 mg | ORAL_TABLET | Freq: Every day | ORAL | Status: DC
  Administered 2024-06-29 – 2024-06-30 (×2): 20 mg via ORAL
  Filled 2024-06-28 (×2): qty 1

## 2024-06-28 MED ORDER — TAMSULOSIN HCL 0.4 MG PO CAPS
0.4000 mg | ORAL_CAPSULE | Freq: Every day | ORAL | Status: DC
  Administered 2024-06-29 – 2024-06-30 (×2): 0.4 mg via ORAL
  Filled 2024-06-28 (×2): qty 1

## 2024-06-28 MED ORDER — LACTATED RINGERS IV BOLUS (SEPSIS)
500.0000 mL | Freq: Once | INTRAVENOUS | Status: AC
  Administered 2024-06-28: 500 mL via INTRAVENOUS

## 2024-06-28 MED ORDER — ENOXAPARIN SODIUM 40 MG/0.4ML IJ SOSY: 40.0000 mg | PREFILLED_SYRINGE | INTRAMUSCULAR | Status: DC

## 2024-06-28 MED ORDER — ACETAMINOPHEN 650 MG RE SUPP: 650.0000 mg | Freq: Four times a day (QID) | RECTAL | Status: DC | PRN

## 2024-06-28 MED ORDER — ACETAMINOPHEN 325 MG PO TABS: 650.0000 mg | ORAL_TABLET | Freq: Four times a day (QID) | ORAL | Status: DC | PRN

## 2024-06-28 MED ORDER — ENOXAPARIN SODIUM 60 MG/0.6ML IJ SOSY
60.0000 mg | PREFILLED_SYRINGE | INTRAMUSCULAR | Status: DC
  Administered 2024-06-28 – 2024-06-29 (×2): 60 mg via SUBCUTANEOUS
  Filled 2024-06-28 (×2): qty 0.6

## 2024-06-28 MED ORDER — HYDROCHLOROTHIAZIDE 12.5 MG PO TABS
12.5000 mg | ORAL_TABLET | Freq: Every day | ORAL | Status: DC
  Administered 2024-06-29 – 2024-06-30 (×2): 12.5 mg via ORAL
  Filled 2024-06-28 (×2): qty 1

## 2024-06-28 MED ORDER — PANTOPRAZOLE SODIUM 40 MG PO TBEC
40.0000 mg | DELAYED_RELEASE_TABLET | Freq: Every day | ORAL | Status: DC
  Administered 2024-06-29 – 2024-06-30 (×2): 40 mg via ORAL
  Filled 2024-06-28 (×2): qty 1

## 2024-06-28 MED ORDER — LACTATED RINGERS IV BOLUS (SEPSIS)
1000.0000 mL | Freq: Once | INTRAVENOUS | Status: AC
  Administered 2024-06-28: 1000 mL via INTRAVENOUS

## 2024-06-28 MED ORDER — ACETAMINOPHEN 325 MG PO TABS
650.0000 mg | ORAL_TABLET | Freq: Once | ORAL | Status: AC
  Administered 2024-06-28: 650 mg via ORAL
  Filled 2024-06-28: qty 2

## 2024-06-28 MED ORDER — LISINOPRIL-HYDROCHLOROTHIAZIDE 20-12.5 MG PO TABS: 1.0000 | ORAL_TABLET | Freq: Every day | ORAL | Status: DC

## 2024-06-28 MED ORDER — ONDANSETRON HCL 4 MG/2ML IJ SOLN: 4.0000 mg | Freq: Four times a day (QID) | INTRAMUSCULAR | Status: DC | PRN

## 2024-06-28 MED ORDER — SODIUM CHLORIDE 0.9 % IV SOLN
2.0000 g | INTRAVENOUS | Status: DC
  Administered 2024-06-29 – 2024-06-30 (×2): 2 g via INTRAVENOUS
  Filled 2024-06-28 (×2): qty 20

## 2024-06-28 MED ORDER — MORPHINE SULFATE (PF) 4 MG/ML IV SOLN
4.0000 mg | Freq: Once | INTRAVENOUS | Status: AC
  Administered 2024-06-28: 4 mg via INTRAVENOUS
  Filled 2024-06-28: qty 1

## 2024-06-28 NOTE — Assessment & Plan Note (Signed)
 -  This complicates overall care and prognosis.

## 2024-06-28 NOTE — Hospital Course (Signed)
 Mr. Susan Arana, Mickey. is a 73 year old male with history of plaque cirrhosis, psoriatic arthritis, history of follicular cancer of the thyroid , morbid obesity, hypertension, hepatic steatosis, history of status post lumbar laminectomy, recurrent major depressive disorder in partial remission, presents ED for chief concerns of abdominal pain, nausea.  Vitals in the ED showed t of 100, rr 18, hr 87, blood pressure 102/44, SpO2 91% on room air.  Serum sodium is 136, potassium 3.8, chloride 102, bicarb 22, BUN of 16, serum creatinine 1.04, eGFR greater than 60, nonfasting blood glucose 116, WBC 19.9, hemoglobin 15.6, platelets of 129.  Lactic acid is 2.9.  COVID/influenza A/influenza B/RSV PCR were negative.  UA showed positive for trace leukocytes and positive for nitrates.  Lipase was normal at 31.  Blood cultures have been collected and are in process.  ED treatment: Morphine  4 mg IV one-time dose, ondansetron  4 mg IV one-time dose, acetaminophen 650 mg p.o. one-time dose, ceftriaxone 2 g IV one-time dose, LR 1 L bolus.

## 2024-06-28 NOTE — Assessment & Plan Note (Addendum)
 Patient does not meet sepsis criteria at this time, though first lactic acid was elevated at 2.9 with markedly elevated leukocytosis to 19.9 Blood cultures x 2 were ordered in process Will follow second lactic acid Continue with ceftriaxone 2 g IV daily to complete a 7-day course Urine culture is processing Maintain MAP > 65 Status post LR 1 L bolus per EDP and on admission I have ordered additional LR 500 mL liter bolus Sodium chloride  infusion at 125 mL/h can be hung after completion of LR boluses Strict I's and O's

## 2024-06-28 NOTE — Progress Notes (Signed)
 CODE SEPSIS - PHARMACY COMMUNICATION  **Broad Spectrum Antibiotics should be administered within 1 hour of Sepsis diagnosis**  Time Code Sepsis Called/Page Received: 1242  Antibiotics Ordered: Ceftriaxone  Time of 1st antibiotic administration: 1436  Additional action taken by pharmacy: Secure chat sent to RN at 1400. RN called at [address] 06/28/2024  12:43 PM

## 2024-06-28 NOTE — Sepsis Progress Note (Signed)
 Elink monitoring for the code sepsis protocol.

## 2024-06-28 NOTE — Assessment & Plan Note (Signed)
 Treatment per above, recheck CBC in the a.m.

## 2024-06-28 NOTE — H&P (Signed)
 History and Physical   Terry Odonnell. FMW:[id number] DOB: 1950-10-15 DOA: 06/28/2024  PCP: Sadie Manna, MD  Outpatient Specialists: Dr. Lady Blanch, [facility] clinic rheumatology Patient coming from: Home  I have personally briefly reviewed patient's old medical records in [facility] EMR.  Chief Concern: Abdominal pain, nausea  HPI: Mr. Terry Odonnell, Odonnell. is a 73 year old male with history of plaque cirrhosis, psoriatic arthritis, history of follicular cancer of the thyroid , morbid obesity, hypertension, hepatic steatosis, history of status post lumbar laminectomy, recurrent major depressive disorder in partial remission, presents ED for chief concerns of abdominal pain, nausea.  Vitals in the ED showed t of 100, rr 18, hr 87, blood pressure 102/44, SpO2 91% on room air.  Serum sodium is 136, potassium 3.8, chloride 102, bicarb 22, BUN of 16, serum creatinine 1.04, eGFR greater than 60, nonfasting blood glucose 116, WBC 19.9, hemoglobin 15.6, platelets of 129.  Lactic acid is 2.9.  COVID/influenza A/influenza B/RSV PCR were negative.  UA showed positive for trace leukocytes and positive for nitrates.  Lipase was normal at 31.  Blood cultures have been collected and are in process.  ED treatment: Morphine  4 mg IV one-time dose, ondansetron  4 mg IV one-time dose, acetaminophen 650 mg p.o. one-time dose, ceftriaxone 2 g IV one-time dose, LR 1 L bolus. ---------------------------------------- At bedside, patient is able to tell me his first and last name, age, location, current calendar year.  Reports that last night at around 5 PM, he was watching TV when he developed abdominal pain, nausea.  He reports burning/pain when he urinates.  He denies hematuria, chest pain, shortness of breath, syncope.  He endorse this chills and fevers and last night he checked his temperature and his wife stated the temperature was 100.  He took acetaminophen last night.  Social history: He  lives at home with his wife.  He denies tobacco, EtOH, recreational drug use.  He is retired.  ROS: Constitutional: no weight change, + fever, + chills ENT/Mouth: no sore throat, no rhinorrhea Eyes: no eye pain, no vision changes Cardiovascular: no chest pain, no dyspnea,  no edema, no palpitations Respiratory: no cough, no sputum, no wheezing Gastrointestinal: + abdominal pain, + nausea, no vomiting, no diarrhea, no constipation Genitourinary: no urinary incontinence, + dysuria, no hematuria Musculoskeletal: no arthralgias, no myalgias Skin: no skin lesions, no pruritus, Neuro: no weakness, no loss of consciousness, no syncope Psych: no anxiety, no depression, no decrease appetite Heme/Lymph: no bruising, no bleeding  ED Course: [place] with EDP, patient requiring hospitalization for chief concerns of urinary tract infection with markedly elevated white cell count and lactic acid.  Assessment/Plan  Principal Problem:   Complicated UTI (urinary tract infection) Active Problems:   Follicular cancer of thyroid  (HCC)   Plaque psoriasis   Morbid obesity (HCC)   Leukocytosis   Essential hypertension   Assessment and Plan:  * Complicated UTI (urinary tract infection) Patient does not meet sepsis criteria at this time, though first lactic acid was elevated at 2.9 with markedly elevated leukocytosis to 19.9 Blood cultures x 2 were ordered in process Will follow second lactic acid Continue with ceftriaxone 2 g IV daily to complete a 7-day course Urine culture is processing Maintain MAP > 65 Status post LR 1 L bolus per EDP and on admission I have ordered additional LR 500 mL liter bolus Sodium chloride  infusion at 125 mL/h can be hung after completion of LR boluses Strict I's and O's  Essential hypertension Home lisinopril-hydrochlorothiazide 1  tablet daily resumed for 06/29/2024 as patient is presenting for UTI and has low normotensive blood pressure Hydralazine 5 mg IV every 6  hours as needed for SBP greater than 170, 5 days  Leukocytosis Treatment per above, recheck CBC in the a.m.  Morbid obesity (HCC) This complicates overall care and prognosis.   Plaque psoriasis Continue outpatient follow-up with rheumatologist  Chart reviewed.   DVT prophylaxis: Enoxaparin Code Status: Full code Diet: Heart healthy Family Communication: Updated his spouse at bedside with patient's permission Disposition Plan: Clinical course Consults called: None at this time Admission status: Telemetry medical, inpatient  Past Medical History:  Diagnosis Date   Anxiety    Arthritis    BPH (benign prostatic hyperplasia)    Depression    Follicular cancer of thyroid  (HCC) 06/10/2015   GERD (gastroesophageal reflux disease)    Hx of psoriasis    Hx of thyroid  cancer    Hypertension    Hypothyroidism    Sleep apnea    Spinal stenosis    Past Surgical History:  Procedure Laterality Date   APPENDECTOMY     COLONOSCOPY N/A 06/02/2020   Procedure: COLONOSCOPY;  Surgeon: Maryruth Ole DASEN, MD;  Location: [facility] ENDOSCOPY;  Service: Endoscopy;  Laterality: N/A;   LUMBAR LAMINECTOMY     ROTATOR CUFF REPAIR     THYROIDECTOMY  2006   Social History:  reports that he has never smoked. He has never used smokeless tobacco. He reports current alcohol use of about 1.0 - 2.0 standard drink of alcohol per week. He reports that he does not use drugs.  Allergies  Allergen Reactions   Chlorhexidine Rash    Had rash following shoulder surgery in distribution of surgical ChloroPrep   Family History  Problem Relation Age of Onset   Lymphoma Father    Family history: Family history reviewed and not pertinent.  Prior to Admission medications   Medication Sig Start Date End Date Taking? Authorizing Provider  losartan-hydrochlorothiazide (HYZAAR) 50-12.5 MG tablet Take 1 tablet by mouth daily. 05/01/24  Yes [provider]  methotrexate (RHEUMATREX) 2.5 MG tablet Take 20 mg by  mouth once a week. 06/19/20  Yes [provider]  levothyroxine (SYNTHROID, LEVOTHROID) 150 MCG tablet Take 150 mcg by mouth daily before breakfast.  12/13/14   [provider]  lisinopril-hydrochlorothiazide (PRINZIDE,ZESTORETIC) 20-12.5 MG tablet Take 1 tablet by mouth daily.  12/13/14   [provider]  omeprazole (PRILOSEC) 20 MG capsule Take 20 mg by mouth daily.  12/13/14   [provider]  ondansetron  (ZOFRAN  ODT) 4 MG disintegrating tablet Allow 1-2 tablets to dissolve in your mouth every 8 hours as needed for nausea/vomiting 04/20/21   Gordan Huxley, MD  sertraline (ZOLOFT) 50 MG tablet Take 50 mg by mouth daily.  12/13/14   [provider]  tamsulosin (FLOMAX) 0.4 MG CAPS capsule Take 0.4 mg by mouth daily.  12/13/14   [provider]   Physical Exam: Vitals:   06/28/24 1309 06/28/24 1400 06/28/24 1445 06/28/24 1500  BP:   (!) 102/39 (!) 102/44  Pulse:   79 80  Resp:   16 (!) 24  Temp:      TempSrc:      SpO2: 95% 96% (!) 89% 91%  Weight:       Constitutional: appears age-appropriate, weak Eyes: PERRL, lids and conjunctivae normal ENMT: Mucous membranes are moist. Posterior pharynx clear of any exudate or lesions. Age-appropriate dentition. Hearing appropriate Neck: normal, supple, no masses, no thyromegaly  Respiratory: clear to auscultation bilaterally, no wheezing, no crackles. Normal respiratory effort. No accessory muscle use.  Cardiovascular: Regular rate and rhythm, no murmurs / rubs / gallops. No extremity edema. 2+ pedal pulses. No carotid bruits.  Abdomen: Morbidly obese abdomen, no tenderness, no masses palpated, no hepatosplenomegaly. Bowel sounds positive.  Musculoskeletal: no clubbing / cyanosis. No joint deformity upper and lower extremities. Good ROM, no contractures, no atrophy. Normal muscle tone.  Skin: no rashes, lesions, ulcers. No induration Neurologic: Sensation intact. Strength 5/5 in all 4.  Psychiatric: Normal  judgment and insight. Alert and oriented x 3. Normal mood.   EKG: independently reviewed, showing sinus rhythm with rate of 87, QTc 426  Chest x-ray on Admission: I personally reviewed and I agree with radiologist reading as below.  CT ABDOMEN PELVIS W CONTRAST Result Date: 06/28/2024 CLINICAL DATA:  Generalized abdominal pain. Urinary tract infection. Nausea. EXAM: CT ABDOMEN AND PELVIS WITH CONTRAST TECHNIQUE: Multidetector CT imaging of the abdomen and pelvis was performed using the standard protocol following bolus administration of intravenous contrast. RADIATION DOSE REDUCTION: This exam was performed according to the departmental dose-optimization program which includes automated exposure control, adjustment of the mA and/or kV according to patient size and/or use of iterative reconstruction technique. CONTRAST:  OMNIPAQUE  IOHEXOL  300 MG/ML  SOLN COMPARISON:  04/19/2021 FINDINGS: Lower chest: Normal-sized heart. Minimal dependent atelectasis on the right. Hepatobiliary: Diffuse low density of the liver. Normal-appearing gallbladder. Two previously demonstrated small hepatic hemangiomata/cysts not needing imaging follow-up. Pancreas: Unremarkable. No pancreatic ductal dilatation or surrounding inflammatory changes. Spleen: Normal in size without focal abnormality. Adrenals/Urinary Tract: Bilateral simple appearing renal cysts not requiring imaging follow-up. Unremarkable adrenal glands, ureters and urinary bladder. Stomach/Bowel: Small number of sigmoid colon diverticula without evidence of diverticulitis. Surgically absent appendix. Unremarkable stomach and small bowel. Vascular/Lymphatic: Mild atheromatous arterial calcifications without aneurysm. No enlarged lymph nodes. Reproductive: Mildly enlarged prostate gland. Other: No abdominal wall hernia or abnormality. No abdominopelvic ascites. Musculoskeletal: Lumbar and lower thoracic spine degenerative changes. IMPRESSION: 1. No acute  abnormality. 2. Diffuse hepatic steatosis. 3. Mild sigmoid diverticulosis. Electronically Signed   By: Elspeth Bathe M.D.   On: 06/28/2024 14:57   DG Chest Port 1 View Result Date: 06/28/2024 CLINICAL DATA:  Questionable sepsis. EXAM: PORTABLE CHEST 1 VIEW COMPARISON:  Chest radiograph dated 01/16/2022. FINDINGS: Shallow inspiration. No focal consolidation, pleural effusion, or pneumothorax. The cardiac silhouette is within normal limits. No acute osseous pathology. IMPRESSION: No active disease. Electronically Signed   By: Vanetta Chou M.D.   On: 06/28/2024 14:17   Labs on Admission: I have personally reviewed following labs  CBC: Recent Labs  Lab 06/28/24 1209  WBC 19.9*  HGB 15.6  HCT 46.7  MCV 91.6  PLT 129*   Basic Metabolic Panel: Recent Labs  Lab 06/28/24 1209  NA 136  K 3.8  CL 101  CO2 22  GLUCOSE 116*  BUN 16  CREATININE 1.04  CALCIUM 9.4   GFR: CrCl cannot be calculated (Unknown ideal weight.).  Liver Function Tests: Recent Labs  Lab 06/28/24 1209  AST 33  ALT 46*  ALKPHOS 40  BILITOT 1.6*  PROT 7.5  ALBUMIN 4.0   Recent Labs  Lab 06/28/24 1209  LIPASE 31   Coagulation Profile: Recent Labs  Lab 06/28/24 1401  INR 1.1   Urine analysis:    Component Value Date/Time   COLORURINE YELLOW (A) 06/28/2024 1209   APPEARANCEUR HAZY (A) 06/28/2024 1209   LABSPEC 1.021 06/28/2024 1209  PHURINE 5.0 06/28/2024 1209   GLUCOSEU NEGATIVE 06/28/2024 1209   HGBUR SMALL (A) 06/28/2024 1209   BILIRUBINUR NEGATIVE 06/28/2024 1209   KETONESUR NEGATIVE 06/28/2024 1209   PROTEINUR 100 (A) 06/28/2024 1209   NITRITE POSITIVE (A) 06/28/2024 1209   LEUKOCYTESUR TRACE (A) 06/28/2024 1209   This document was prepared using Dragon Voice Recognition software and may include unintentional dictation errors.  Dr. Sherre [facility]  If 7PM-7AM, please contact overnight-coverage provider If 7AM-7PM, please contact day attending  provider [email or web address]  06/28/2024, 4:01 PM

## 2024-06-28 NOTE — ED Triage Notes (Signed)
 First nurse note: Arrived by [facility] from home for abdominal pain. Reports nausea. Confirmed UTI   Hypertension and remission thyroid  cancer  EMS vitals: 160/67 b/p 95% RA 90HR

## 2024-06-28 NOTE — ED Notes (Signed)
Bed changed

## 2024-06-28 NOTE — ED Provider Notes (Signed)
 [facility] Provider Note    Event Date/Time   First MD Initiated Contact with Patient 06/28/24 1256     (approximate)   History   Weakness   HPI  Terry Lemonte Al. is a 73 year old male presenting to the emergency department for evaluation of weakness and dysuria.  Yesterday, patient began to have urinary symptoms.  He was going to see his primary care doctor this morning, but felt so weak that they ended up calling EMS.  Does feel similar to when he has had UTIs in the past but does report abdominal pain which is atypical for him.  Denies flank pain.  Subjective fevers at home.  Reviewed PCP visit from 04/24/2024, treated for hypertension, psoriatic arthritis, history of thyroid  cancer noted.     Physical Exam   Triage Vital Signs: ED Triage Vitals  Encounter Vitals Group     BP 06/28/24 1203 (!) 157/80     Girls Systolic BP Percentile --      Girls Diastolic BP Percentile --      Boys Systolic BP Percentile --      Boys Diastolic BP Percentile --      Pulse Rate 06/28/24 1203 87     Resp 06/28/24 1203 18     Temp 06/28/24 1203 100 F (37.8 C)     Temp Source 06/28/24 1203 Oral     SpO2 06/28/24 1203 93 %     Weight 06/28/24 1201 279 lb 15.8 oz (127 kg)     Height --      Head Circumference --      Peak Flow --      Pain Score 06/28/24 1200 8     Pain Loc --      Pain Education --      Exclude from Growth Chart --     Most recent vital signs: Vitals:   06/28/24 1445 06/28/24 1500  BP: (!) 102/39 (!) 102/44  Pulse: 79 80  Resp: 16 (!) 24  Temp:    SpO2: (!) 89% 91%     General: Awake, interactive  CV:  Good peripheral perfusion Resp:  Unlabored respirations Abd:  Nondistended, soft, tender to palpation most notably in the right lower quadrant Neuro:  Symmetric facial movement, fluid speech    ED Results / Procedures / Treatments   Labs (all labs ordered are listed, but only abnormal results are displayed) Labs  Reviewed  COMPREHENSIVE METABOLIC PANEL WITH GFR - Abnormal; Notable for the following components:      Result Value   Glucose, Bld 116 (*)    ALT 46 (*)    Total Bilirubin 1.6 (*)    All other components within normal limits  CBC - Abnormal; Notable for the following components:   WBC 19.9 (*)    Platelets 129 (*)    All other components within normal limits  URINALYSIS, ROUTINE W REFLEX MICROSCOPIC - Abnormal; Notable for the following components:   Color, Urine YELLOW (*)    APPearance HAZY (*)    Hgb urine dipstick SMALL (*)    Protein, ur 100 (*)    Nitrite POSITIVE (*)    Leukocytes,Ua TRACE (*)    Bacteria, UA RARE (*)    All other components within normal limits  LACTIC ACID, PLASMA - Abnormal; Notable for the following components:   Lactic Acid, Venous 2.9 (*)    All other components within normal limits  RESP PANEL BY RT-PCR (RSV, FLU A&B,  COVID)  RVPGX2  CULTURE, BLOOD (ROUTINE X 2)  CULTURE, BLOOD (ROUTINE X 2)  URINE CULTURE  LIPASE, BLOOD  PROTIME-INR  LACTIC ACID, PLASMA     EKG EKG independently reviewed and interpreted by myself demonstrates:    RADIOLOGY Imaging independently reviewed and interpreted by myself demonstrates:   Formal Radiology Read:  CT ABDOMEN PELVIS W CONTRAST Result Date: 06/28/2024 CLINICAL DATA:  Generalized abdominal pain. Urinary tract infection. Nausea. EXAM: CT ABDOMEN AND PELVIS WITH CONTRAST TECHNIQUE: Multidetector CT imaging of the abdomen and pelvis was performed using the standard protocol following bolus administration of intravenous contrast. RADIATION DOSE REDUCTION: This exam was performed according to the departmental dose-optimization program which includes automated exposure control, adjustment of the mA and/or kV according to patient size and/or use of iterative reconstruction technique. CONTRAST:  OMNIPAQUE  IOHEXOL  300 MG/ML  SOLN COMPARISON:  04/19/2021 FINDINGS: Lower chest: Normal-sized heart. Minimal  dependent atelectasis on the right. Hepatobiliary: Diffuse low density of the liver. Normal-appearing gallbladder. Two previously demonstrated small hepatic hemangiomata/cysts not needing imaging follow-up. Pancreas: Unremarkable. No pancreatic ductal dilatation or surrounding inflammatory changes. Spleen: Normal in size without focal abnormality. Adrenals/Urinary Tract: Bilateral simple appearing renal cysts not requiring imaging follow-up. Unremarkable adrenal glands, ureters and urinary bladder. Stomach/Bowel: Small number of sigmoid colon diverticula without evidence of diverticulitis. Surgically absent appendix. Unremarkable stomach and small bowel. Vascular/Lymphatic: Mild atheromatous arterial calcifications without aneurysm. No enlarged lymph nodes. Reproductive: Mildly enlarged prostate gland. Other: No abdominal wall hernia or abnormality. No abdominopelvic ascites. Musculoskeletal: Lumbar and lower thoracic spine degenerative changes. IMPRESSION: 1. No acute abnormality. 2. Diffuse hepatic steatosis. 3. Mild sigmoid diverticulosis. Electronically Signed   By: Elspeth Bathe M.D.   On: 06/28/2024 14:57   DG Chest Port 1 View Result Date: 06/28/2024 CLINICAL DATA:  Questionable sepsis. EXAM: PORTABLE CHEST 1 VIEW COMPARISON:  Chest radiograph dated 01/16/2022. FINDINGS: Shallow inspiration. No focal consolidation, pleural effusion, or pneumothorax. The cardiac silhouette is within normal limits. No acute osseous pathology. IMPRESSION: No active disease. Electronically Signed   By: Vanetta Chou M.D.   On: 06/28/2024 14:17    PROCEDURES:  Critical Care performed: Yes, see critical care procedure note(s)  CRITICAL CARE Performed by: Nilsa Dade   Total critical care time: 31 minutes  Critical care time was exclusive of separately billable procedures and treating other patients.  Critical care was necessary to treat or prevent imminent or life-threatening deterioration.  Critical care was  time spent personally by me on the following activities: development of treatment plan with patient and/or surrogate as well as nursing, discussions with consultants, evaluation of patient's response to treatment, examination of patient, obtaining history from patient or surrogate, ordering and performing treatments and interventions, ordering and review of laboratory studies, ordering and review of radiographic studies, pulse oximetry and re-evaluation of patient's condition.   Procedures   MEDICATIONS ORDERED IN ED: Medications  lactated ringers bolus 1,000 mL (has no administration in time range)  lactated ringers bolus 1,000 mL (1,000 mLs Intravenous New Bag/Given 06/28/24 1436)  cefTRIAXone (ROCEPHIN) 2 g in sodium chloride  0.9 % 100 mL IVPB (0 g Intravenous Stopped 06/28/24 1525)  morphine  (PF) 4 MG/ML injection 4 mg (4 mg Intravenous Given 06/28/24 1438)  ondansetron  (ZOFRAN ) injection 4 mg (4 mg Intravenous Given 06/28/24 1438)  acetaminophen (TYLENOL) tablet 650 mg (650 mg Oral Given 06/28/24 1440)  iohexol  (OMNIPAQUE ) 300 MG/ML solution 100 mL (100 mLs Intravenous Contrast Given 06/28/24 1405)     IMPRESSION / MDM /  ASSESSMENT AND PLAN / ED COURSE  I reviewed the triage vital signs and the nursing notes.  Differential diagnosis includes, but is not limited to, UTI, appendicitis, colitis, other acute intra-abdominal process, anemia, electrolyte abnormality  Patient's presentation is most consistent with acute presentation with potential threat to life or bodily function.  73 year old male presenting to the emergency department for evaluation of urinary symptoms and weakness.  Stable vitals on presentation.  Labs with leukocytosis WBC of 19.9, CMP without critical derangements.  Additional labs pending.  Will obtain CT to further evaluate and treat symptomatically.  Clinical Course as of 06/28/24 1545  Thu Jun 28, 2024  1522 CT without acute findings.  Heart rate currently in the  90s, does meet SIRS criteria.  Has already received cultures, empiric antibiotics.  Elevated lactate at 2.9.  Will order another liter of IV fluid.  Urinalysis is concerning for infection with positive nitrites, 21-50 white blood cells.  Urine culture added on.  Will reach out to hospitalist team to discuss admission. [NR]    Clinical Course User Index [NR] Levander Slate, MD     FINAL CLINICAL IMPRESSION(S) / ED DIAGNOSES   Final diagnoses:  Acute cystitis without hematuria  Lactic acidosis     Rx / DC Orders   ED Discharge Orders     None        Note:  This document was prepared using Dragon voice recognition software and may include unintentional dictation errors.   Levander Slate, MD 06/28/24 [phone]

## 2024-06-28 NOTE — Plan of Care (Signed)

## 2024-06-28 NOTE — ED Triage Notes (Signed)
 Was to see PCP today at 1130 to get antibiotics for concerns of UTI. C?O weakness since last night and generalized abd pain today.

## 2024-06-28 NOTE — Progress Notes (Signed)
 PHARMACIST - PHYSICIAN COMMUNICATION  CONCERNING:  Enoxaparin (Lovenox) for DVT Prophylaxis    RECOMMENDATION: Patient was prescribed enoxaprin 40mg  q24 hours for VTE prophylaxis.   Filed Weights   06/28/24 1201  Weight: 127 kg (279 lb 15.8 oz)    Body mass index is 39.07 kg/m.  Estimated Creatinine Clearance: 87.2 mL/min (by C-G formula based on SCr of 1.04 mg/dL).   Based on [facility] policy patient is candidate for enoxaparin 0.5mg /kg TBW SQ every 24 hours based on BMI being >30.   DESCRIPTION: Pharmacy has adjusted enoxaparin dose per [facility] policy.  Patient is now receiving enoxaparin 60 mg every 24 hours   Estill CHRISTELLA Lutes, PharmD, BCPS Clinical Pharmacist 06/28/2024 4:08 PM

## 2024-06-28 NOTE — Assessment & Plan Note (Addendum)
 Home lisinopril-hydrochlorothiazide 1 tablet daily resumed for 06/29/2024 as patient is presenting for UTI and has low normotensive blood pressure Hydralazine 5 mg IV every 6 hours as needed for SBP greater than 170, 5 days

## 2024-06-28 NOTE — Assessment & Plan Note (Signed)
 Continue outpatient follow-up with rheumatologist

## 2024-06-29 DIAGNOSIS — I10 Essential (primary) hypertension: Secondary | ICD-10-CM | POA: Diagnosis not present

## 2024-06-29 DIAGNOSIS — L400 Psoriasis vulgaris: Secondary | ICD-10-CM

## 2024-06-29 DIAGNOSIS — D72829 Elevated white blood cell count, unspecified: Secondary | ICD-10-CM

## 2024-06-29 DIAGNOSIS — C73 Malignant neoplasm of thyroid gland: Secondary | ICD-10-CM

## 2024-06-29 DIAGNOSIS — N390 Urinary tract infection, site not specified: Secondary | ICD-10-CM | POA: Diagnosis not present

## 2024-06-29 LAB — CBC
HCT: 40.7 % (ref 39.0–52.0)
Hemoglobin: 13.6 g/dL (ref 13.0–17.0)
MCH: 31.2 pg (ref 26.0–34.0)
MCHC: 33.4 g/dL (ref 30.0–36.0)
MCV: 93.3 fL (ref 80.0–100.0)
Platelets: 107 K/uL — ABNORMAL LOW (ref 150–400)
RBC: 4.36 MIL/uL (ref 4.22–5.81)
RDW: 15.1 % (ref 11.5–15.5)
WBC: 17.3 K/uL — ABNORMAL HIGH (ref 4.0–10.5)
nRBC: 0 % (ref 0.0–0.2)

## 2024-06-29 LAB — TROPONIN I (HIGH SENSITIVITY)
Troponin I (High Sensitivity): 10 ng/L (ref <18)
Troponin I (High Sensitivity): 10 ng/L

## 2024-06-29 LAB — BASIC METABOLIC PANEL WITH GFR
Anion gap: 7 (ref 5–15)
BUN: 17 mg/dL (ref 8–23)
CO2: 26 mmol/L (ref 22–32)
Calcium: 8.2 mg/dL — ABNORMAL LOW (ref 8.9–10.3)
Chloride: 108 mmol/L (ref 98–111)
Creatinine, Ser: 0.95 mg/dL (ref 0.61–1.24)
GFR, Estimated: >60 mL/min (ref >60)
Glucose, Bld: 91 mg/dL (ref 70–99)
Potassium: 3.6 mmol/L (ref 3.5–5.1)
Sodium: 141 mmol/L (ref 135–145)

## 2024-06-29 MED ORDER — SERTRALINE HCL 50 MG PO TABS
100.0000 mg | ORAL_TABLET | Freq: Every day | ORAL | Status: DC
  Administered 2024-06-29 – 2024-06-30 (×2): 100 mg via ORAL
  Filled 2024-06-29 (×2): qty 2

## 2024-06-29 NOTE — Progress Notes (Signed)
 PROGRESS NOTE    Terry Newt Georgina Raddle.  FMW:[id number] DOB: [birth year] DOA: 06/28/2024 PCP: Sadie Manna, MD  Chief Complaint  Patient presents with   Weakness    Hospital Course:  Terry Sava. is a 73 year old male with history of psoriatic arthritis on methotrexate, history of follicular cancer of the thyroid , morbid obesity, hypertension, hepatic steatosis, MDD, who presents with abdominal pain and nausea which began 2 days prior to arrival.  Patient reports he has been experiencing upper abdominal pain, and fever with associated dysuria.  On arrival to ED labs reveal leukocytosis, thrombocytosis, lactic acid of 2.9.  Respiratory viral panel negative.  UA with leukocytes and nitrates.  Patient was started on antibiotics and admitted for treatment of UTI.  Subjective: This morning patient reports he is starting to feel better.  Still endorsing some dysuria but reports his abdominal pain is resolving.  Does endorse some vague left-sided chest pain when he takes a deep breath.  Not reproducible upon palpation.  Has already resolved by the time of my evaluation.   Objective: Vitals:   06/29/24 0012 06/29/24 0516 06/29/24 0731 06/29/24 1146  BP: (!) 131/57 (!) 102/46 108/60 (!) 106/53  Pulse: 71 67 64 66  Resp: 18 18 16 18   Temp: 98.2 F (36.8 C) 98.1 F (36.7 C) (!) 97.5 F (36.4 C) 97.9 F (36.6 C)  TempSrc:   Oral Oral  SpO2: 95% 96% 97% 97%  Weight:      Height:        Intake/Output Summary (Last 24 hours) at 06/29/2024 1552 Last data filed at 06/29/2024 0900 Gross per 24 hour  Intake 2799.62 ml  Output --  Net 2799.62 ml   Filed Weights   06/28/24 1201  Weight: 127 kg    Examination: General exam: Appears calm and comfortable, NAD  Respiratory system: No work of breathing, symmetric chest wall expansion Cardiovascular system: S1 & S2 heard, RRR.  Gastrointestinal system: Abdomen is nondistended, soft and nontender.  Neuro: Alert and oriented.  No focal neurological deficits. Extremities: Symmetric, expected ROM Skin: No rashes, lesions Psychiatry: Demonstrates appropriate judgement and insight. Mood & affect appropriate for situation.   Assessment & Plan:  Principal Problem:   Complicated UTI (urinary tract infection) Active Problems:   Follicular cancer of thyroid  (HCC)   Plaque psoriasis   Morbid obesity (HCC)   Leukocytosis   Essential hypertension    Complicated UTI - Leukocytosis on arrival 19.9, trend CBC - Blood cultures ordered, will follow to completion.  Currently negative - Continue ceftriaxone for now - Follow urine culture - Status post IV fluids - Encourage oral hydration for now - Does have history of UTI but typically responds well to p.o. meds  On immunosuppressive therapy Psoriatic arthritis - On methotrexate at home.  Continue outpatient follow-up with rheumatology - Broad-spectrum antibiotics as above  Left-sided chest pain - Does not appear cardiac in nature.  Has already resolved at the time my evaluation - On telemetry without events - High-sensitivity troponin 10. - Suspect this is related to rebound GERD.  Patient is meant to be taking PPI daily but has missed last 2 days  GERD - Continue PPI  Hypertension - Resume home meds, titrate as needed  Depression - Continue home meds  Hypothyroidism - Continue levothyroxine at home dose  Morbid obesity BMI 39 Hepatic steatosis - Outpatient follow up for lifestyle modification and risk factor management  Sigmoid diverticulosis  - As seen on CT.  No diverticulitis -  Continue bowel regimen  Bilateral simple appearing renal cysts Small hepatic hemangioma/cysts - No follow-up imaging recommended.  History of follicular cancer to the thyroid  - Stable.  Aware.  Outpatient follow-up with oncology  DVT prophylaxis: Lovenox   Code Status: Full Code Disposition: Inpatient pending clinical resolution.  Hopefully home in next 48  hours  Consultants:    Procedures:    Antimicrobials:  Anti-infectives (From admission, onward)    Start     Dose/Rate Route Frequency Ordered Stop   06/29/24 1000  cefTRIAXone (ROCEPHIN) 2 g in sodium chloride  0.9 % 100 mL IVPB        2 g 200 mL/hr over 30 Minutes Intravenous Every 24 hours 06/28/24 1550 07/05/24 0959   06/28/24 1245  cefTRIAXone (ROCEPHIN) 2 g in sodium chloride  0.9 % 100 mL IVPB        2 g 200 mL/hr over 30 Minutes Intravenous Once 06/28/24 1242 06/28/24 1525       Data Reviewed: I have personally reviewed following labs and imaging studies CBC: Recent Labs  Lab 06/28/24 1209 06/29/24 0337  WBC 19.9* 17.3*  HGB 15.6 13.6  HCT 46.7 40.7  MCV 91.6 93.3  PLT 129* 107*   Basic Metabolic Panel: Recent Labs  Lab 06/28/24 1209 06/29/24 0337  NA 136 141  K 3.8 3.6  CL 101 108  CO2 22 26  GLUCOSE 116* 91  BUN 16 17  CREATININE 1.04 0.95  CALCIUM 9.4 8.2*   GFR: Estimated Creatinine Clearance: 95.4 mL/min (by C-G formula based on SCr of 0.95 mg/dL). Liver Function Tests: Recent Labs  Lab 06/28/24 1209  AST 33  ALT 46*  ALKPHOS 40  BILITOT 1.6*  PROT 7.5  ALBUMIN 4.0   CBG: No results for input(s): GLUCAP in the last 168 hours.  Recent Results (from the past 240 hours)  Blood Culture (routine x 2)     Status: None (Preliminary result)   Collection Time: 06/28/24 12:42 PM   Specimen: BLOOD  Result Value Ref Range Status   Specimen Description BLOOD LEFT ANTECUBITAL  Final   Special Requests   Final    BOTTLES DRAWN AEROBIC AND ANAEROBIC Blood Culture adequate volume   Culture   Final    NO GROWTH < 24 HOURS Performed at [facility], [address] [place], [place] [address]    Report Status PENDING  Incomplete  Blood Culture (routine x 2)     Status: None (Preliminary result)   Collection Time: 06/28/24 12:47 PM   Specimen: BLOOD  Result Value Ref Range Status   Specimen Description BLOOD BLOOD RIGHT HAND  Final    Special Requests   Final    BOTTLES DRAWN AEROBIC AND ANAEROBIC Blood Culture results may not be optimal due to an inadequate volume of blood received in culture bottles   Culture   Final    NO GROWTH < 24 HOURS Performed at [facility], [address], [place], [place] [address]    Report Status PENDING  Incomplete  Resp panel by RT-PCR (RSV, Flu A&B, Covid) Anterior Nasal Swab     Status: None   Collection Time: 06/28/24  2:01 PM   Specimen: Anterior Nasal Swab  Result Value Ref Range Status   SARS Coronavirus 2 by RT PCR NEGATIVE NEGATIVE Final    Comment: (NOTE) SARS-CoV-2 target nucleic acids are NOT DETECTED.  The SARS-CoV-2 RNA is generally detectable in upper respiratory specimens during the acute phase of infection. The lowest concentration  of SARS-CoV-2 viral copies this assay can detect is 138 copies/mL. A negative result does not preclude SARS-Cov-2 infection and should not be used as the sole basis for treatment or other patient management decisions. A negative result may occur with  improper specimen collection/handling, submission of specimen other than nasopharyngeal swab, presence of viral mutation(s) within the areas targeted by this assay, and inadequate number of viral copies(<138 copies/mL). A negative result must be combined with clinical observations, patient history, and epidemiological information. The expected result is Negative.  Fact Sheet for Patients:  BloggerCourse.com  Fact Sheet for Healthcare Providers:  SeriousBroker.it  This test is no t yet approved or cleared by the [place]  FDA and  has been authorized for detection and/or diagnosis of SARS-CoV-2 by FDA under an Emergency Use Authorization (EUA). This EUA will remain  in effect (meaning this test can be used) for the duration of the COVID-19 declaration under Section 564(b)(1) of the Act, 21 U.S.C.section 360bbb-3(b)(1),  unless the authorization is terminated  or revoked sooner.       Influenza A by PCR NEGATIVE NEGATIVE Final   Influenza B by PCR NEGATIVE NEGATIVE Final    Comment: (NOTE) The Xpert Xpress SARS-CoV-2/FLU/RSV plus assay is intended as an aid in the diagnosis of influenza from Nasopharyngeal swab specimens and should not be used as a sole basis for treatment. Nasal washings and aspirates are unacceptable for Xpert Xpress SARS-CoV-2/FLU/RSV testing.  Fact Sheet for Patients: BloggerCourse.com  Fact Sheet for Healthcare Providers: SeriousBroker.it  This test is not yet approved or cleared by the [place]  FDA and has been authorized for detection and/or diagnosis of SARS-CoV-2 by FDA under an Emergency Use Authorization (EUA). This EUA will remain in effect (meaning this test can be used) for the duration of the COVID-19 declaration under Section 564(b)(1) of the Act, 21 U.S.C. section 360bbb-3(b)(1), unless the authorization is terminated or revoked.     Resp Syncytial Virus by PCR NEGATIVE NEGATIVE Final    Comment: (NOTE) Fact Sheet for Patients: BloggerCourse.com  Fact Sheet for Healthcare Providers: SeriousBroker.it  This test is not yet approved or cleared by the [place]  FDA and has been authorized for detection and/or diagnosis of SARS-CoV-2 by FDA under an Emergency Use Authorization (EUA). This EUA will remain in effect (meaning this test can be used) for the duration of the COVID-19 declaration under Section 564(b)(1) of the Act, 21 U.S.C. section 360bbb-3(b)(1), unless the authorization is terminated or revoked.  Performed at [facility], [address], [place], [place] [address]      Radiology Studies: CT ABDOMEN PELVIS W CONTRAST Result Date: 06/28/2024 CLINICAL DATA:  Generalized abdominal pain. Urinary tract infection. Nausea. EXAM: CT  ABDOMEN AND PELVIS WITH CONTRAST TECHNIQUE: Multidetector CT imaging of the abdomen and pelvis was performed using the standard protocol following bolus administration of intravenous contrast. RADIATION DOSE REDUCTION: This exam was performed according to the departmental dose-optimization program which includes automated exposure control, adjustment of the mA and/or kV according to patient size and/or use of iterative reconstruction technique. CONTRAST:  OMNIPAQUE  IOHEXOL  300 MG/ML  SOLN COMPARISON:  04/19/2021 FINDINGS: Lower chest: Normal-sized heart. Minimal dependent atelectasis on the right. Hepatobiliary: Diffuse low density of the liver. Normal-appearing gallbladder. Two previously demonstrated small hepatic hemangiomata/cysts not needing imaging follow-up. Pancreas: Unremarkable. No pancreatic ductal dilatation or surrounding inflammatory changes. Spleen: Normal in size without focal abnormality. Adrenals/Urinary Tract: Bilateral simple appearing renal cysts not requiring imaging follow-up. Unremarkable adrenal glands, ureters and  urinary bladder. Stomach/Bowel: Small number of sigmoid colon diverticula without evidence of diverticulitis. Surgically absent appendix. Unremarkable stomach and small bowel. Vascular/Lymphatic: Mild atheromatous arterial calcifications without aneurysm. No enlarged lymph nodes. Reproductive: Mildly enlarged prostate gland. Other: No abdominal wall hernia or abnormality. No abdominopelvic ascites. Musculoskeletal: Lumbar and lower thoracic spine degenerative changes. IMPRESSION: 1. No acute abnormality. 2. Diffuse hepatic steatosis. 3. Mild sigmoid diverticulosis. Electronically Signed   By: Elspeth Bathe M.D.   On: 06/28/2024 14:57   DG Chest Port 1 View Result Date: 06/28/2024 CLINICAL DATA:  Questionable sepsis. EXAM: PORTABLE CHEST 1 VIEW COMPARISON:  Chest radiograph dated 01/16/2022. FINDINGS: Shallow inspiration. No focal consolidation, pleural effusion, or  pneumothorax. The cardiac silhouette is within normal limits. No acute osseous pathology. IMPRESSION: No active disease. Electronically Signed   By: Vanetta Chou M.D.   On: 06/28/2024 14:17    Scheduled Meds:  enoxaparin (LOVENOX) injection  60 mg Subcutaneous Q24H   hydrochlorothiazide  12.5 mg Oral Daily   levothyroxine  150 mcg Oral QAC breakfast   lisinopril  20 mg Oral Daily   pantoprazole  40 mg Oral Daily   tamsulosin  0.4 mg Oral Daily   Continuous Infusions:  sodium chloride  125 mL/hr at 06/29/24 0954   cefTRIAXone (ROCEPHIN)  IV 2 g (06/29/24 0957)     LOS: 1 day  MDM: Patient is high risk for one or more organ failure.  They necessitate ongoing hospitalization for continued IV therapies and subsequent lab monitoring. Total time spent interpreting labs and vitals, reviewing the medical record, coordinating care amongst consultants and care team members, directly assessing and discussing care with the patient and/or family: 55 min  Milicent Acheampong, DO [facility]  To contact the attending physician between 7A-7P please use Epic Chat. To contact the covering physician during after hours 7P-7A, please review Amion.  06/29/2024, 3:52 PM   *This document has been created with the assistance of dictation software. Please excuse typographical errors. *

## 2024-06-30 ENCOUNTER — Other Ambulatory Visit: Payer: Self-pay

## 2024-06-30 ENCOUNTER — Encounter: Payer: Self-pay | Admitting: Hematology and Oncology

## 2024-06-30 DIAGNOSIS — N390 Urinary tract infection, site not specified: Secondary | ICD-10-CM | POA: Diagnosis not present

## 2024-06-30 DIAGNOSIS — C73 Malignant neoplasm of thyroid gland: Secondary | ICD-10-CM | POA: Diagnosis not present

## 2024-06-30 DIAGNOSIS — I10 Essential (primary) hypertension: Secondary | ICD-10-CM | POA: Diagnosis not present

## 2024-06-30 DIAGNOSIS — D72829 Elevated white blood cell count, unspecified: Secondary | ICD-10-CM | POA: Diagnosis not present

## 2024-06-30 LAB — COMPREHENSIVE METABOLIC PANEL WITH GFR
ALT: 26 U/L (ref 0–44)
AST: 24 U/L (ref 15–41)
Albumin: 3.2 g/dL — ABNORMAL LOW (ref 3.5–5.0)
Alkaline Phosphatase: 37 U/L — ABNORMAL LOW (ref 38–126)
Anion gap: 9 (ref 5–15)
BUN: 14 mg/dL (ref 8–23)
CO2: 24 mmol/L (ref 22–32)
Calcium: 8.3 mg/dL — ABNORMAL LOW (ref 8.9–10.3)
Chloride: 106 mmol/L (ref 98–111)
Creatinine, Ser: 0.85 mg/dL (ref 0.61–1.24)
GFR, Estimated: >60 mL/min (ref >60)
Glucose, Bld: 152 mg/dL — ABNORMAL HIGH (ref 70–99)
Potassium: 3.2 mmol/L — ABNORMAL LOW (ref 3.5–5.1)
Sodium: 139 mmol/L (ref 135–145)
Total Bilirubin: 0.6 mg/dL (ref 0.0–1.2)
Total Protein: 6.3 g/dL — ABNORMAL LOW (ref 6.5–8.1)

## 2024-06-30 LAB — CBC WITH DIFFERENTIAL/PLATELET
Abs Immature Granulocytes: 0.07 K/uL (ref 0.00–0.07)
Basophils Absolute: 0 K/uL (ref 0.0–0.1)
Basophils Relative: 0 %
Eosinophils Absolute: 0.3 K/uL (ref 0.0–0.5)
Eosinophils Relative: 3 %
HCT: 39.4 % (ref 39.0–52.0)
Hemoglobin: 13.6 g/dL (ref 13.0–17.0)
Immature Granulocytes: 1 %
Lymphocytes Relative: 15 %
Lymphs Abs: 1.5 K/uL (ref 0.7–4.0)
MCH: 31.7 pg (ref 26.0–34.0)
MCHC: 34.5 g/dL (ref 30.0–36.0)
MCV: 91.8 fL (ref 80.0–100.0)
Monocytes Absolute: 0.4 K/uL (ref 0.1–1.0)
Monocytes Relative: 4 %
Neutro Abs: 7.5 K/uL (ref 1.7–7.7)
Neutrophils Relative %: 77 %
Platelets: 130 K/uL — ABNORMAL LOW (ref 150–400)
RBC: 4.29 MIL/uL (ref 4.22–5.81)
RDW: 15.1 % (ref 11.5–15.5)
WBC: 9.8 K/uL (ref 4.0–10.5)
nRBC: 0 % (ref 0.0–0.2)

## 2024-06-30 LAB — URINE CULTURE: Culture: >=100000 — AB

## 2024-06-30 MED ORDER — CEFPODOXIME PROXETIL 200 MG PO TABS
200.0000 mg | ORAL_TABLET | Freq: Two times a day (BID) | ORAL | 0 refills | Status: AC
  Filled 2024-06-30: qty 10, 5d supply, fill #0

## 2024-06-30 MED ORDER — AMOXICILLIN-POT CLAVULANATE 875-125 MG PO TABS
1.0000 | ORAL_TABLET | Freq: Two times a day (BID) | ORAL | 0 refills | Status: DC
  Filled 2024-06-30: qty 10, 5d supply, fill #0

## 2024-06-30 NOTE — Care Management Important Message (Signed)
 Important Message  Patient Details  Name: Terry Odonnell. MRN: [id number] Date of Birth: 08-13-1951   Important Message Given:  Yes - Medicare IM     Rojelio SHAUNNA Rattler 06/30/2024, 2:10 PM

## 2024-06-30 NOTE — Plan of Care (Signed)
  Problem: Pain Managment: Goal: General experience of comfort will improve and/or be controlled Outcome: Progressing   Problem: Pain Managment: Goal: General experience of comfort will improve and/or be controlled Outcome: Progressing   Problem: Elimination: Goal: Will not experience complications related to bowel motility Outcome: Progressing   Problem: Coping: Goal: Level of anxiety will decrease Outcome: Progressing    Problem: Respiratory: Goal: Ability to maintain adequate ventilation will improve Outcome: Progressing   Problem: Clinical Measurements: Goal: Ability to maintain clinical measurements within normal limits will improve Outcome: Progressing

## 2024-06-30 NOTE — Discharge Summary (Signed)
 DISCHARGE SUMMARY    Terry Odonnell. FMW:[id number] DOB: 1951/07/15 DOA: 06/28/2024  PCP: Sadie Manna, MD  Admit date: 06/28/2024 Discharge date: 06/30/2024   Recommendations for Outpatient Follow-up:  Follow up with PCP in 1-2 weeks for chronic condition management  Hospital Course: Terry Odonnell. is a 73 year old male with history of psoriatic arthritis on methotrexate, history of follicular cancer of the thyroid , morbid obesity, hypertension, hepatic steatosis, MDD, who presents with abdominal pain and nausea which began 2 days prior to arrival.  Patient reports he has been experiencing upper abdominal pain, and fever with associated dysuria.  On arrival to ED labs reveal leukocytosis, thrombocytosis, lactic acid of 2.9.  Respiratory viral panel negative.  UA with leukocytes and nitrates.  Patient was started on antibiotics and admitted for treatment of UTI.  Gradually patient's leukocytosis resolved and his dysuria improved.  Urine culture resulted positive for pansensitive E. coli.  He is being discharged on 5 additional days of antibiotic therapy.  Meds to beds delivered medication to bedside prior to discharge.  Care plan was discussed directly with the patient as well as his wife on day of DC.  Complicated UTI, secondary to pansensitive E. coli -Complicated by systemic infection and immunocompromise status. - Leukocytosis on arrival 19.9, down trended to normal. - Blood cultures ordered, negative today.  Will follow to completion. - Improved significantly on ceftriaxone, will DC with 5 days cefpodoxime at DC to complete course - Encourage oral hydration for now - Does have history of UTI but typically responds well to p.o. meds   On immunosuppressive therapy Psoriatic arthritis - On methotrexate at home.  Continue outpatient follow-up with rheumatology - Avoiding PCN due to possible interaction with MTX   Left-sided chest pain - Does not appear cardiac  in nature.  Has already resolved at the time my evaluation - On telemetry without events - High-sensitivity troponin 10. - Suspect this is related to rebound GERD.  Patient is meant to be taking PPI daily but has missed last 2 days   GERD - Continue PPI  Chest pain, atypical - Brief, resolved spontaneously.  Likely secondary to rebound GERD as patient was abruptly discontinued on his PPI.   - High-sensitivity troponin 10 with no change on repeat.  EKG on arrival WNL.   Hypertension - Resume home meds, titrate as needed   Depression - Continue home meds   Hypothyroidism - Continue levothyroxine at home dose   Morbid obesity BMI 39 Hepatic steatosis - Outpatient follow up for lifestyle modification and risk factor management   Sigmoid diverticulosis  - As seen on CT.  No diverticulitis - Continue bowel regimen   Bilateral simple appearing renal cysts Small hepatic hemangioma/cysts - No follow-up imaging recommended.   History of follicular cancer to the thyroid  - Stable.  Aware.  Outpatient follow-up with oncology    Discharge Instructions  Discharge Instructions     Call MD for:  difficulty breathing, headache or visual disturbances   Complete by: As directed    Call MD for:  persistant dizziness or light-headedness   Complete by: As directed    Call MD for:  persistant nausea and vomiting   Complete by: As directed    Call MD for:  severe uncontrolled pain   Complete by: As directed    Call MD for:  temperature >100.4   Complete by: As directed    Diet general   Complete by: As directed    Discharge instructions  Complete by: As directed    Follow up with your primary care physician to discuss the medication changes during this admission. If you begin having fever, worsening pain with urination, worsening abdominal pain, please return to the ER for evaluation   Increase activity slowly   Complete by: As directed       Allergies as of 06/30/2024        Reactions   Chlorhexidine Rash   Had rash following shoulder surgery in distribution of surgical ChloroPrep        Medication List     STOP taking these medications    lisinopril-hydrochlorothiazide 20-12.5 MG tablet Commonly known as: ZESTORETIC   ondansetron  4 MG disintegrating tablet Commonly known as: Zofran  ODT       TAKE these medications    amoxicillin-clavulanate 875-125 MG tablet Commonly known as: AUGMENTIN Take 1 tablet by mouth 2 (two) times daily for 5 days.   folic acid 1 MG tablet Commonly known as: FOLVITE Take 1 mg by mouth daily.   levothyroxine 25 MCG tablet Commonly known as: SYNTHROID Take 25 mcg by mouth daily before breakfast. Takes along with the 150 mg on Saturdays and Sundays only   levothyroxine 150 MCG tablet Commonly known as: SYNTHROID Take 150 mcg by mouth daily before breakfast. Pt stated he takes 150 mg Monday-Friday and on weekends he takes along with the 25 mg in a total of 175 mg   losartan-hydrochlorothiazide 50-12.5 MG tablet Commonly known as: HYZAAR Take 1 tablet by mouth daily.   methotrexate 2.5 MG tablet Commonly known as: RHEUMATREX Take 20 mg by mouth once a week. Pt stated he takes 8 tabs every 7 days on thursday   omeprazole 20 MG capsule Commonly known as: PRILOSEC Take 20 mg by mouth daily.   sertraline 50 MG tablet Commonly known as: ZOLOFT Take 100 mg by mouth daily.   tamsulosin 0.4 MG Caps capsule Commonly known as: FLOMAX Take 0.4 mg by mouth daily.        Allergies  Allergen Reactions   Chlorhexidine Rash    Had rash following shoulder surgery in distribution of surgical ChloroPrep    Consultations:    Procedures/Studies: CT ABDOMEN PELVIS W CONTRAST Result Date: 06/28/2024 CLINICAL DATA:  Generalized abdominal pain. Urinary tract infection. Nausea. EXAM: CT ABDOMEN AND PELVIS WITH CONTRAST TECHNIQUE: Multidetector CT imaging of the abdomen and pelvis was performed using the standard  protocol following bolus administration of intravenous contrast. RADIATION DOSE REDUCTION: This exam was performed according to the departmental dose-optimization program which includes automated exposure control, adjustment of the mA and/or kV according to patient size and/or use of iterative reconstruction technique. CONTRAST:  OMNIPAQUE  IOHEXOL  300 MG/ML  SOLN COMPARISON:  04/19/2021 FINDINGS: Lower chest: Normal-sized heart. Minimal dependent atelectasis on the right. Hepatobiliary: Diffuse low density of the liver. Normal-appearing gallbladder. Two previously demonstrated small hepatic hemangiomata/cysts not needing imaging follow-up. Pancreas: Unremarkable. No pancreatic ductal dilatation or surrounding inflammatory changes. Spleen: Normal in size without focal abnormality. Adrenals/Urinary Tract: Bilateral simple appearing renal cysts not requiring imaging follow-up. Unremarkable adrenal glands, ureters and urinary bladder. Stomach/Bowel: Small number of sigmoid colon diverticula without evidence of diverticulitis. Surgically absent appendix. Unremarkable stomach and small bowel. Vascular/Lymphatic: Mild atheromatous arterial calcifications without aneurysm. No enlarged lymph nodes. Reproductive: Mildly enlarged prostate gland. Other: No abdominal wall hernia or abnormality. No abdominopelvic ascites. Musculoskeletal: Lumbar and lower thoracic spine degenerative changes. IMPRESSION: 1. No acute abnormality. 2. Diffuse hepatic steatosis. 3. Mild sigmoid diverticulosis. Electronically  Signed   By: Elspeth Bathe M.D.   On: 06/28/2024 14:57   DG Chest Port 1 View Result Date: 06/28/2024 CLINICAL DATA:  Questionable sepsis. EXAM: PORTABLE CHEST 1 VIEW COMPARISON:  Chest radiograph dated 01/16/2022. FINDINGS: Shallow inspiration. No focal consolidation, pleural effusion, or pneumothorax. The cardiac silhouette is within normal limits. No acute osseous pathology. IMPRESSION: No active disease.  Electronically Signed   By: Vanetta Chou M.D.   On: 06/28/2024 14:17      Discharge Exam: Vitals:   06/30/24 0823 06/30/24 1206  BP: (!) 143/75 (!) 141/62  Pulse: 64 68  Resp: 18 17  Temp: 97.7 F (36.5 C) 99.1 F (37.3 C)  SpO2: 98% 97%   Vitals:   06/29/24 2349 06/30/24 0433 06/30/24 0823 06/30/24 1206  BP: (!) 136/58 136/73 (!) 143/75 (!) 141/62  Pulse: 73 66 64 68  Resp:   18 17  Temp: 97.7 F (36.5 C) 97.8 F (36.6 C) 97.7 F (36.5 C) 99.1 F (37.3 C)  TempSrc:   Oral Oral  SpO2: 96% 97% 98% 97%  Weight:      Height:        Constitutional:  Normal appearance. Non toxic-appearing.  HENT: Head Normocephalic and atraumatic.  Mucous membranes are moist.  Eyes:  Extraocular intact. Conjunctivae normal.  Cardiovascular: Rate and Rhythm: Normal rate and regular rhythm.  Pulmonary: Non labored, symmetric rise of chest wall.  Skin: warm and dry. not jaundiced.  Neurological: No focal deficit present. alert. Oriented.  Psychiatric: Mood and Affect congruent.    The results of significant diagnostics from this hospitalization (including imaging, microbiology, ancillary and laboratory) are listed below for reference.     Microbiology: Recent Results (from the past 240 hours)  Urine Culture     Status: Abnormal   Collection Time: 06/28/24 12:09 PM   Specimen: Urine, Clean Catch  Result Value Ref Range Status   Specimen Description   Final    URINE, CLEAN CATCH Performed at [facility], [address], [place], [place] [address]    Special Requests   Final    NONE Performed at [facility], [address] [place], [place] [address]    Culture >=100,000 COLONIES/mL ESCHERICHIA COLI (A)  Final   Report Status 06/30/2024 FINAL  Final   Organism ID, Bacteria ESCHERICHIA COLI (A)  Final      Susceptibility   Escherichia coli - MIC*    AMPICILLIN <=2 SENSITIVE Sensitive     CEFAZOLIN (URINE) Value in next row Sensitive      <=1  SENSITIVEThis is a modified FDA-approved test that has been validated and its performance characteristics determined by the reporting laboratory.  This laboratory is certified under the Clinical Laboratory Improvement Amendments CLIA as qualified to perform high complexity clinical laboratory testing.    CEFEPIME Value in next row Sensitive      <=1 SENSITIVEThis is a modified FDA-approved test that has been validated and its performance characteristics determined by the reporting laboratory.  This laboratory is certified under the Clinical Laboratory Improvement Amendments CLIA as qualified to perform high complexity clinical laboratory testing.    ERTAPENEM Value in next row Sensitive      <=1 SENSITIVEThis is a modified FDA-approved test that has been validated and its performance characteristics determined by the reporting laboratory.  This laboratory is certified under the Clinical Laboratory Improvement Amendments CLIA as qualified to perform high complexity clinical laboratory testing.    CEFTRIAXONE Value in next row Sensitive      <=  1 SENSITIVEThis is a modified FDA-approved test that has been validated and its performance characteristics determined by the reporting laboratory.  This laboratory is certified under the Clinical Laboratory Improvement Amendments CLIA as qualified to perform high complexity clinical laboratory testing.    CIPROFLOXACIN Value in next row Sensitive      <=1 SENSITIVEThis is a modified FDA-approved test that has been validated and its performance characteristics determined by the reporting laboratory.  This laboratory is certified under the Clinical Laboratory Improvement Amendments CLIA as qualified to perform high complexity clinical laboratory testing.    GENTAMICIN Value in next row Sensitive      <=1 SENSITIVEThis is a modified FDA-approved test that has been validated and its performance characteristics determined by the reporting laboratory.  This laboratory is  certified under the Clinical Laboratory Improvement Amendments CLIA as qualified to perform high complexity clinical laboratory testing.    NITROFURANTOIN Value in next row Sensitive      <=1 SENSITIVEThis is a modified FDA-approved test that has been validated and its performance characteristics determined by the reporting laboratory.  This laboratory is certified under the Clinical Laboratory Improvement Amendments CLIA as qualified to perform high complexity clinical laboratory testing.    TRIMETH/SULFA Value in next row Sensitive      <=1 SENSITIVEThis is a modified FDA-approved test that has been validated and its performance characteristics determined by the reporting laboratory.  This laboratory is certified under the Clinical Laboratory Improvement Amendments CLIA as qualified to perform high complexity clinical laboratory testing.    AMPICILLIN/SULBACTAM Value in next row Sensitive      <=1 SENSITIVEThis is a modified FDA-approved test that has been validated and its performance characteristics determined by the reporting laboratory.  This laboratory is certified under the Clinical Laboratory Improvement Amendments CLIA as qualified to perform high complexity clinical laboratory testing.    PIP/TAZO Value in next row Sensitive      <=4 SENSITIVEThis is a modified FDA-approved test that has been validated and its performance characteristics determined by the reporting laboratory.  This laboratory is certified under the Clinical Laboratory Improvement Amendments CLIA as qualified to perform high complexity clinical laboratory testing.    MEROPENEM Value in next row Sensitive      <=4 SENSITIVEThis is a modified FDA-approved test that has been validated and its performance characteristics determined by the reporting laboratory.  This laboratory is certified under the Clinical Laboratory Improvement Amendments CLIA as qualified to perform high complexity clinical laboratory testing.    * >=100,000  COLONIES/mL ESCHERICHIA COLI  Blood Culture (routine x 2)     Status: None (Preliminary result)   Collection Time: 06/28/24 12:42 PM   Specimen: BLOOD  Result Value Ref Range Status   Specimen Description BLOOD LEFT ANTECUBITAL  Final   Special Requests   Final    BOTTLES DRAWN AEROBIC AND ANAEROBIC Blood Culture adequate volume   Culture   Final    NO GROWTH 2 DAYS Performed at [facility], [address], [place], [place] [address]    Report Status PENDING  Incomplete  Blood Culture (routine x 2)     Status: None (Preliminary result)   Collection Time: 06/28/24 12:47 PM   Specimen: BLOOD  Result Value Ref Range Status   Specimen Description BLOOD BLOOD RIGHT HAND  Final   Special Requests   Final    BOTTLES DRAWN AEROBIC AND ANAEROBIC Blood Culture results may not be optimal due to an inadequate volume of blood received in  culture bottles   Culture   Final    NO GROWTH 2 DAYS Performed at [facility], [address] [place], [place] [address]    Report Status PENDING  Incomplete  Resp panel by RT-PCR (RSV, Flu A&B, Covid) Anterior Nasal Swab     Status: None   Collection Time: 06/28/24  2:01 PM   Specimen: Anterior Nasal Swab  Result Value Ref Range Status   SARS Coronavirus 2 by RT PCR NEGATIVE NEGATIVE Final    Comment: (NOTE) SARS-CoV-2 target nucleic acids are NOT DETECTED.  The SARS-CoV-2 RNA is generally detectable in upper respiratory specimens during the acute phase of infection. The lowest concentration of SARS-CoV-2 viral copies this assay can detect is 138 copies/mL. A negative result does not preclude SARS-Cov-2 infection and should not be used as the sole basis for treatment or other patient management decisions. A negative result may occur with  improper specimen collection/handling, submission of specimen other than nasopharyngeal swab, presence of viral mutation(s) within the areas targeted by this assay, and inadequate number of  viral copies(<138 copies/mL). A negative result must be combined with clinical observations, patient history, and epidemiological information. The expected result is Negative.  Fact Sheet for Patients:  BloggerCourse.com  Fact Sheet for Healthcare Providers:  SeriousBroker.it  This test is no t yet approved or cleared by the [place]  FDA and  has been authorized for detection and/or diagnosis of SARS-CoV-2 by FDA under an Emergency Use Authorization (EUA). This EUA will remain  in effect (meaning this test can be used) for the duration of the COVID-19 declaration under Section 564(b)(1) of the Act, 21 U.S.C.section 360bbb-3(b)(1), unless the authorization is terminated  or revoked sooner.       Influenza A by PCR NEGATIVE NEGATIVE Final   Influenza B by PCR NEGATIVE NEGATIVE Final    Comment: (NOTE) The Xpert Xpress SARS-CoV-2/FLU/RSV plus assay is intended as an aid in the diagnosis of influenza from Nasopharyngeal swab specimens and should not be used as a sole basis for treatment. Nasal washings and aspirates are unacceptable for Xpert Xpress SARS-CoV-2/FLU/RSV testing.  Fact Sheet for Patients: BloggerCourse.com  Fact Sheet for Healthcare Providers: SeriousBroker.it  This test is not yet approved or cleared by the [place]  FDA and has been authorized for detection and/or diagnosis of SARS-CoV-2 by FDA under an Emergency Use Authorization (EUA). This EUA will remain in effect (meaning this test can be used) for the duration of the COVID-19 declaration under Section 564(b)(1) of the Act, 21 U.S.C. section 360bbb-3(b)(1), unless the authorization is terminated or revoked.     Resp Syncytial Virus by PCR NEGATIVE NEGATIVE Final    Comment: (NOTE) Fact Sheet for Patients: BloggerCourse.com  Fact Sheet for Healthcare  Providers: SeriousBroker.it  This test is not yet approved or cleared by the [place]  FDA and has been authorized for detection and/or diagnosis of SARS-CoV-2 by FDA under an Emergency Use Authorization (EUA). This EUA will remain in effect (meaning this test can be used) for the duration of the COVID-19 declaration under Section 564(b)(1) of the Act, 21 U.S.C. section 360bbb-3(b)(1), unless the authorization is terminated or revoked.  Performed at [facility], [address] [place], [place] [address]      Labs: BNP (last 3 results) No results for input(s): BNP in the last 8760 hours. Basic Metabolic Panel: Recent Labs  Lab 06/28/24 1209 06/29/24 0337 06/30/24 1025  NA 136 141 139  K 3.8 3.6 3.2*  CL 101  108 106  CO2 22 26 24   GLUCOSE 116* 91 152*  BUN 16 17 14   CREATININE 1.04 0.95 0.85  CALCIUM 9.4 8.2* 8.3*   Liver Function Tests: Recent Labs  Lab 06/28/24 1209 06/30/24 1025  AST 33 24  ALT 46* 26  ALKPHOS 40 37*  BILITOT 1.6* 0.6  PROT 7.5 6.3*  ALBUMIN 4.0 3.2*   Recent Labs  Lab 06/28/24 1209  LIPASE 31   No results for input(s): AMMONIA in the last 168 hours. CBC: Recent Labs  Lab 06/28/24 1209 06/29/24 0337 06/30/24 1025  WBC 19.9* 17.3* 9.8  NEUTROABS  --   --  7.5  HGB 15.6 13.6 13.6  HCT 46.7 40.7 39.4  MCV 91.6 93.3 91.8  PLT 129* 107* 130*   Cardiac Enzymes: No results for input(s): CKTOTAL, CKMB, CKMBINDEX, TROPONINI in the last 168 hours. BNP: Invalid input(s): POCBNP CBG: No results for input(s): GLUCAP in the last 168 hours. D-Dimer No results for input(s): DDIMER in the last 72 hours. Hgb A1c No results for input(s): HGBA1C in the last 72 hours. Lipid Profile No results for input(s): CHOL, HDL, LDLCALC, TRIG, CHOLHDL, LDLDIRECT in the last 72 hours. Thyroid  function studies No results for input(s): TSH, T4TOTAL, T3FREE, THYROIDAB in the  last 72 hours.  Invalid input(s): FREET3 Anemia work up No results for input(s): VITAMINB12, FOLATE, FERRITIN, TIBC, IRON, RETICCTPCT in the last 72 hours. Urinalysis    Component Value Date/Time   COLORURINE YELLOW (A) 06/28/2024 1209   APPEARANCEUR HAZY (A) 06/28/2024 1209   LABSPEC 1.021 06/28/2024 1209   PHURINE 5.0 06/28/2024 1209   GLUCOSEU NEGATIVE 06/28/2024 1209   HGBUR SMALL (A) 06/28/2024 1209   BILIRUBINUR NEGATIVE 06/28/2024 1209   KETONESUR NEGATIVE 06/28/2024 1209   PROTEINUR 100 (A) 06/28/2024 1209   NITRITE POSITIVE (A) 06/28/2024 1209   LEUKOCYTESUR TRACE (A) 06/28/2024 1209   Sepsis Labs Recent Labs  Lab 06/28/24 1209 06/29/24 0337 06/30/24 1025  WBC 19.9* 17.3* 9.8   Microbiology Recent Results (from the past 240 hours)  Urine Culture     Status: Abnormal   Collection Time: 06/28/24 12:09 PM   Specimen: Urine, Clean Catch  Result Value Ref Range Status   Specimen Description   Final    URINE, CLEAN CATCH Performed at [facility], [address] [place], [place] [address]    Special Requests   Final    NONE Performed at [facility], [address], [place], [place] [address]    Culture >=100,000 COLONIES/mL ESCHERICHIA COLI (A)  Final   Report Status 06/30/2024 FINAL  Final   Organism ID, Bacteria ESCHERICHIA COLI (A)  Final      Susceptibility   Escherichia coli - MIC*    AMPICILLIN <=2 SENSITIVE Sensitive     CEFAZOLIN (URINE) Value in next row Sensitive      <=1 SENSITIVEThis is a modified FDA-approved test that has been validated and its performance characteristics determined by the reporting laboratory.  This laboratory is certified under the Clinical Laboratory Improvement Amendments CLIA as qualified to perform high complexity clinical laboratory testing.    CEFEPIME Value in next row Sensitive      <=1 SENSITIVEThis is a modified FDA-approved test that has been validated and its performance  characteristics determined by the reporting laboratory.  This laboratory is certified under the Clinical Laboratory Improvement Amendments CLIA as qualified to perform high complexity clinical laboratory testing.    ERTAPENEM Value in next row Sensitive      <=  1 SENSITIVEThis is a modified FDA-approved test that has been validated and its performance characteristics determined by the reporting laboratory.  This laboratory is certified under the Clinical Laboratory Improvement Amendments CLIA as qualified to perform high complexity clinical laboratory testing.    CEFTRIAXONE Value in next row Sensitive      <=1 SENSITIVEThis is a modified FDA-approved test that has been validated and its performance characteristics determined by the reporting laboratory.  This laboratory is certified under the Clinical Laboratory Improvement Amendments CLIA as qualified to perform high complexity clinical laboratory testing.    CIPROFLOXACIN Value in next row Sensitive      <=1 SENSITIVEThis is a modified FDA-approved test that has been validated and its performance characteristics determined by the reporting laboratory.  This laboratory is certified under the Clinical Laboratory Improvement Amendments CLIA as qualified to perform high complexity clinical laboratory testing.    GENTAMICIN Value in next row Sensitive      <=1 SENSITIVEThis is a modified FDA-approved test that has been validated and its performance characteristics determined by the reporting laboratory.  This laboratory is certified under the Clinical Laboratory Improvement Amendments CLIA as qualified to perform high complexity clinical laboratory testing.    NITROFURANTOIN Value in next row Sensitive      <=1 SENSITIVEThis is a modified FDA-approved test that has been validated and its performance characteristics determined by the reporting laboratory.  This laboratory is certified under the Clinical Laboratory Improvement Amendments CLIA as qualified to  perform high complexity clinical laboratory testing.    TRIMETH/SULFA Value in next row Sensitive      <=1 SENSITIVEThis is a modified FDA-approved test that has been validated and its performance characteristics determined by the reporting laboratory.  This laboratory is certified under the Clinical Laboratory Improvement Amendments CLIA as qualified to perform high complexity clinical laboratory testing.    AMPICILLIN/SULBACTAM Value in next row Sensitive      <=1 SENSITIVEThis is a modified FDA-approved test that has been validated and its performance characteristics determined by the reporting laboratory.  This laboratory is certified under the Clinical Laboratory Improvement Amendments CLIA as qualified to perform high complexity clinical laboratory testing.    PIP/TAZO Value in next row Sensitive      <=4 SENSITIVEThis is a modified FDA-approved test that has been validated and its performance characteristics determined by the reporting laboratory.  This laboratory is certified under the Clinical Laboratory Improvement Amendments CLIA as qualified to perform high complexity clinical laboratory testing.    MEROPENEM Value in next row Sensitive      <=4 SENSITIVEThis is a modified FDA-approved test that has been validated and its performance characteristics determined by the reporting laboratory.  This laboratory is certified under the Clinical Laboratory Improvement Amendments CLIA as qualified to perform high complexity clinical laboratory testing.    * >=100,000 COLONIES/mL ESCHERICHIA COLI  Blood Culture (routine x 2)     Status: None (Preliminary result)   Collection Time: 06/28/24 12:42 PM   Specimen: BLOOD  Result Value Ref Range Status   Specimen Description BLOOD LEFT ANTECUBITAL  Final   Special Requests   Final    BOTTLES DRAWN AEROBIC AND ANAEROBIC Blood Culture adequate volume   Culture   Final    NO GROWTH 2 DAYS Performed at [facility], [address]  [place], [place] [address]    Report Status PENDING  Incomplete  Blood Culture (routine x 2)     Status: None (Preliminary result)   Collection  Time: 06/28/24 12:47 PM   Specimen: BLOOD  Result Value Ref Range Status   Specimen Description BLOOD BLOOD RIGHT HAND  Final   Special Requests   Final    BOTTLES DRAWN AEROBIC AND ANAEROBIC Blood Culture results may not be optimal due to an inadequate volume of blood received in culture bottles   Culture   Final    NO GROWTH 2 DAYS Performed at [facility], [address] [place], [place] [address]    Report Status PENDING  Incomplete  Resp panel by RT-PCR (RSV, Flu A&B, Covid) Anterior Nasal Swab     Status: None   Collection Time: 06/28/24  2:01 PM   Specimen: Anterior Nasal Swab  Result Value Ref Range Status   SARS Coronavirus 2 by RT PCR NEGATIVE NEGATIVE Final    Comment: (NOTE) SARS-CoV-2 target nucleic acids are NOT DETECTED.  The SARS-CoV-2 RNA is generally detectable in upper respiratory specimens during the acute phase of infection. The lowest concentration of SARS-CoV-2 viral copies this assay can detect is 138 copies/mL. A negative result does not preclude SARS-Cov-2 infection and should not be used as the sole basis for treatment or other patient management decisions. A negative result may occur with  improper specimen collection/handling, submission of specimen other than nasopharyngeal swab, presence of viral mutation(s) within the areas targeted by this assay, and inadequate number of viral copies(<138 copies/mL). A negative result must be combined with clinical observations, patient history, and epidemiological information. The expected result is Negative.  Fact Sheet for Patients:  BloggerCourse.com  Fact Sheet for Healthcare Providers:  SeriousBroker.it  This test is no t yet approved or cleared by the [place]  FDA and  has been authorized for  detection and/or diagnosis of SARS-CoV-2 by FDA under an Emergency Use Authorization (EUA). This EUA will remain  in effect (meaning this test can be used) for the duration of the COVID-19 declaration under Section 564(b)(1) of the Act, 21 U.S.C.section 360bbb-3(b)(1), unless the authorization is terminated  or revoked sooner.       Influenza A by PCR NEGATIVE NEGATIVE Final   Influenza B by PCR NEGATIVE NEGATIVE Final    Comment: (NOTE) The Xpert Xpress SARS-CoV-2/FLU/RSV plus assay is intended as an aid in the diagnosis of influenza from Nasopharyngeal swab specimens and should not be used as a sole basis for treatment. Nasal washings and aspirates are unacceptable for Xpert Xpress SARS-CoV-2/FLU/RSV testing.  Fact Sheet for Patients: BloggerCourse.com  Fact Sheet for Healthcare Providers: SeriousBroker.it  This test is not yet approved or cleared by the [place]  FDA and has been authorized for detection and/or diagnosis of SARS-CoV-2 by FDA under an Emergency Use Authorization (EUA). This EUA will remain in effect (meaning this test can be used) for the duration of the COVID-19 declaration under Section 564(b)(1) of the Act, 21 U.S.C. section 360bbb-3(b)(1), unless the authorization is terminated or revoked.     Resp Syncytial Virus by PCR NEGATIVE NEGATIVE Final    Comment: (NOTE) Fact Sheet for Patients: BloggerCourse.com  Fact Sheet for Healthcare Providers: SeriousBroker.it  This test is not yet approved or cleared by the [place]  FDA and has been authorized for detection and/or diagnosis of SARS-CoV-2 by FDA under an Emergency Use Authorization (EUA). This EUA will remain in effect (meaning this test can be used) for the duration of the COVID-19 declaration under Section 564(b)(1) of the Act, 21 U.S.C. section 360bbb-3(b)(1), unless the authorization is  terminated or revoked.  Performed at [facility]  [facility], [address], [place], [place] [address]      Time coordinating discharge: 32 min   SIGNED: Lorane Poland, DO [facility] 06/30/2024, 1:16 PM Pager   If 7PM-7AM, please contact night-coverage

## 2024-07-03 LAB — CULTURE, BLOOD (ROUTINE X 2)
Culture: NO GROWTH
Culture: NO GROWTH
Special Requests: ADEQUATE

## 2024-07-08 ENCOUNTER — Encounter: Payer: Self-pay | Admitting: Hematology and Oncology

## 2024-10-16 ENCOUNTER — Other Ambulatory Visit (HOSPITAL_COMMUNITY): Payer: Self-pay
